# Patient Record
Sex: Female | Born: 1970
Health system: Southern US, Community
[De-identification: ages and names within clinical notes are randomized; demographics above are authoritative.]

## PROBLEM LIST (undated history)

## (undated) DIAGNOSIS — F419 Anxiety disorder, unspecified: Secondary | ICD-10-CM

## (undated) DIAGNOSIS — F329 Major depressive disorder, single episode, unspecified: Secondary | ICD-10-CM

## (undated) DIAGNOSIS — D649 Anemia, unspecified: Secondary | ICD-10-CM

## (undated) DIAGNOSIS — F32A Depression, unspecified: Secondary | ICD-10-CM

## (undated) DIAGNOSIS — E785 Hyperlipidemia, unspecified: Secondary | ICD-10-CM

## (undated) DIAGNOSIS — I10 Essential (primary) hypertension: Secondary | ICD-10-CM

## (undated) HISTORY — DX: Essential (primary) hypertension: I10

## (undated) HISTORY — DX: Depression, unspecified: F32.A

## (undated) HISTORY — DX: Major depressive disorder, single episode, unspecified: F32.9

## (undated) HISTORY — DX: Anemia, unspecified: D64.9

## (undated) HISTORY — PX: TOOTH EXTRACTION: SHX859

## (undated) HISTORY — DX: Hyperlipidemia, unspecified: E78.5

## (undated) HISTORY — DX: Anxiety disorder, unspecified: F41.9

## (undated) HISTORY — DX: Morbid (severe) obesity due to excess calories: E66.01

---

## 2004-01-20 ENCOUNTER — Ambulatory Visit (HOSPITAL_COMMUNITY): Admission: RE | Admit: 2004-01-20 | Discharge: 2004-01-20 | Payer: Self-pay | Admitting: Cardiology

## 2004-01-25 ENCOUNTER — Other Ambulatory Visit: Admission: RE | Admit: 2004-01-25 | Discharge: 2004-01-25 | Payer: Self-pay | Admitting: Internal Medicine

## 2004-06-13 ENCOUNTER — Ambulatory Visit: Payer: Self-pay | Admitting: Internal Medicine

## 2004-06-19 ENCOUNTER — Ambulatory Visit: Payer: Self-pay | Admitting: Family Medicine

## 2004-06-22 ENCOUNTER — Ambulatory Visit: Payer: Self-pay | Admitting: Family Medicine

## 2004-06-27 ENCOUNTER — Ambulatory Visit: Payer: Self-pay | Admitting: Family Medicine

## 2004-07-03 ENCOUNTER — Ambulatory Visit: Payer: Self-pay | Admitting: Family Medicine

## 2004-07-11 ENCOUNTER — Ambulatory Visit: Payer: Self-pay | Admitting: Family Medicine

## 2004-07-12 ENCOUNTER — Ambulatory Visit: Payer: Self-pay | Admitting: *Deleted

## 2004-10-24 ENCOUNTER — Ambulatory Visit: Payer: Self-pay | Admitting: Family Medicine

## 2004-10-24 LAB — CONVERTED CEMR LAB: Hgb A1c MFr Bld: 10.9 %

## 2004-11-03 ENCOUNTER — Ambulatory Visit: Payer: Self-pay | Admitting: Family Medicine

## 2004-11-06 ENCOUNTER — Ambulatory Visit: Payer: Self-pay | Admitting: Family Medicine

## 2004-11-06 LAB — CONVERTED CEMR LAB: Hgb A1c MFr Bld: 9 %

## 2004-11-16 ENCOUNTER — Ambulatory Visit: Payer: Self-pay | Admitting: Family Medicine

## 2005-01-10 ENCOUNTER — Ambulatory Visit: Payer: Self-pay | Admitting: Family Medicine

## 2005-02-23 ENCOUNTER — Ambulatory Visit: Payer: Self-pay | Admitting: Internal Medicine

## 2005-03-22 ENCOUNTER — Ambulatory Visit: Payer: Self-pay | Admitting: Family Medicine

## 2005-10-08 ENCOUNTER — Ambulatory Visit: Payer: Self-pay | Admitting: Family Medicine

## 2005-10-08 LAB — CONVERTED CEMR LAB: Hgb A1c MFr Bld: 9.4 %

## 2005-10-24 ENCOUNTER — Encounter (INDEPENDENT_AMBULATORY_CARE_PROVIDER_SITE_OTHER): Payer: Self-pay | Admitting: Family Medicine

## 2005-10-24 LAB — CONVERTED CEMR LAB: Pap Smear: NORMAL

## 2005-11-19 ENCOUNTER — Ambulatory Visit: Payer: Self-pay | Admitting: Family Medicine

## 2005-11-19 ENCOUNTER — Encounter (INDEPENDENT_AMBULATORY_CARE_PROVIDER_SITE_OTHER): Payer: Self-pay | Admitting: Family Medicine

## 2006-01-09 ENCOUNTER — Ambulatory Visit: Payer: Self-pay | Admitting: Family Medicine

## 2006-03-06 ENCOUNTER — Ambulatory Visit: Payer: Self-pay | Admitting: Family Medicine

## 2006-08-03 ENCOUNTER — Encounter (INDEPENDENT_AMBULATORY_CARE_PROVIDER_SITE_OTHER): Payer: Self-pay | Admitting: Family Medicine

## 2006-08-03 DIAGNOSIS — F329 Major depressive disorder, single episode, unspecified: Secondary | ICD-10-CM | POA: Insufficient documentation

## 2006-12-11 ENCOUNTER — Encounter (INDEPENDENT_AMBULATORY_CARE_PROVIDER_SITE_OTHER): Payer: Self-pay | Admitting: *Deleted

## 2007-05-29 ENCOUNTER — Encounter (INDEPENDENT_AMBULATORY_CARE_PROVIDER_SITE_OTHER): Payer: Self-pay | Admitting: Family Medicine

## 2007-10-06 ENCOUNTER — Encounter: Admission: RE | Admit: 2007-10-06 | Discharge: 2007-12-16 | Payer: Self-pay | Admitting: Internal Medicine

## 2011-08-10 ENCOUNTER — Ambulatory Visit (INDEPENDENT_AMBULATORY_CARE_PROVIDER_SITE_OTHER): Payer: Private Health Insurance - Indemnity | Admitting: Obstetrics and Gynecology

## 2011-08-10 ENCOUNTER — Encounter: Payer: Self-pay | Admitting: Obstetrics and Gynecology

## 2011-08-10 VITALS — BP 106/60 | Resp 16 | Ht 64.5 in | Wt 303.0 lb

## 2011-08-10 DIAGNOSIS — Z124 Encounter for screening for malignant neoplasm of cervix: Secondary | ICD-10-CM

## 2011-08-10 LAB — HIV ANTIBODY (ROUTINE TESTING W REFLEX): HIV: NONREACTIVE
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
## 2011-08-15 LAB — PAP IG W/ RFLX HPV ASCU

## 2012-06-24 ENCOUNTER — Emergency Department (HOSPITAL_COMMUNITY)
Admission: EM | Admit: 2012-06-24 | Discharge: 2012-06-24 | Disposition: A | Discharge status: Home / Self Care | Payer: No Typology Code available for payment source | Source: Home / Self Care

## 2012-06-24 ENCOUNTER — Encounter (HOSPITAL_COMMUNITY): Payer: Self-pay

## 2012-06-24 DIAGNOSIS — E785 Hyperlipidemia, unspecified: Secondary | ICD-10-CM

## 2012-06-24 DIAGNOSIS — I10 Essential (primary) hypertension: Secondary | ICD-10-CM

## 2012-06-24 DIAGNOSIS — F329 Major depressive disorder, single episode, unspecified: Secondary | ICD-10-CM

## 2012-06-24 DIAGNOSIS — E119 Type 2 diabetes mellitus without complications: Secondary | ICD-10-CM

## 2012-06-24 DIAGNOSIS — J329 Chronic sinusitis, unspecified: Secondary | ICD-10-CM

## 2012-06-24 LAB — BASIC METABOLIC PANEL
BUN: 11 mg/dL (ref 6–23)
CO2: 23 mEq/L (ref 19–32)
Calcium: 9.1 mg/dL (ref 8.4–10.5)
Chloride: 97 mEq/L (ref 96–112)
Creatinine, Ser: 0.51 mg/dL (ref 0.50–1.10)
GFR calc Af Amer: >90 mL/min (ref >90)
GFR calc non Af Amer: >90 mL/min (ref >90)
Glucose, Bld: 319 mg/dL — ABNORMAL HIGH (ref 70–99)
Potassium: 3.8 mEq/L (ref 3.5–5.1)
Sodium: 132 mEq/L — ABNORMAL LOW (ref 135–145)

## 2012-06-24 LAB — LIPID PANEL
Cholesterol: 252 mg/dL — ABNORMAL HIGH (ref 0–200)
HDL: 45 mg/dL (ref >39)
LDL Cholesterol: 183 mg/dL — ABNORMAL HIGH (ref 0–99)
Total CHOL/HDL Ratio: 5.6 RATIO
Triglycerides: 121 mg/dL (ref <150)
VLDL: 24 mg/dL (ref 0–40)

## 2012-06-24 LAB — HEMOGLOBIN A1C
Hgb A1c MFr Bld: 12.2 % — ABNORMAL HIGH (ref <5.7)
Mean Plasma Glucose: 303 mg/dL — ABNORMAL HIGH (ref <117)

## 2012-06-24 MED ORDER — PIOGLITAZONE HCL 30 MG PO TABS: 30.0000 mg | ORAL_TABLET | Freq: Every day | ORAL | Status: DC

## 2012-06-24 MED ORDER — VITAMIN D 1000 UNITS PO TABS: 1000.0000 [IU] | ORAL_TABLET | Freq: Every day | ORAL | Status: DC

## 2012-06-24 MED ORDER — HYDROCHLOROTHIAZIDE 25 MG PO TABS: 25.0000 mg | ORAL_TABLET | Freq: Every day | ORAL | Status: DC

## 2012-06-24 MED ORDER — FLUCONAZOLE 100 MG PO TABS: 100.0000 mg | ORAL_TABLET | Freq: Every day | ORAL | Status: DC

## 2012-06-24 MED ORDER — AMOXICILLIN 500 MG PO TABS: 500.0000 mg | ORAL_TABLET | Freq: Two times a day (BID) | ORAL | Status: DC

## 2012-06-24 MED ORDER — SERTRALINE HCL 50 MG PO TABS: 50.0000 mg | ORAL_TABLET | Freq: Every day | ORAL | Status: DC

## 2012-06-24 MED ORDER — METFORMIN HCL 500 MG PO TABS: 500.0000 mg | ORAL_TABLET | Freq: Two times a day (BID) | ORAL | Status: DC

## 2012-06-24 MED ORDER — FERROUS SULFATE 325 (65 FE) MG PO TABS: 325.0000 mg | ORAL_TABLET | Freq: Every day | ORAL | Status: DC

## 2012-06-24 MED ORDER — INSULIN DETEMIR 100 UNIT/ML ~~LOC~~ SOLN: 10.0000 [IU] | Freq: Every day | SUBCUTANEOUS | Status: DC

## 2012-06-24 MED ORDER — ATORVASTATIN CALCIUM 10 MG PO TABS: 10.0000 mg | ORAL_TABLET | Freq: Every day | ORAL | Status: DC

## 2012-06-24 NOTE — ED Provider Notes (Signed)
History     CSN: [id number]  Arrival date & time 06/24/12  1026   First MD Initiated Contact with Patient 06/24/12 1104      Chief Complaint  Patient presents with  . Establish Care    (Consider location/radiation/quality/duration/timing/severity/associated sxs/prior treatment) HPI Patient is 42 year old female who presents to clinic for followup of diabetes, hypertension, hyperlipidemia. She reports being out of medicines for 3 months. She expresses concern with sugar levels, she has not been checking that regularly as she did not have any supplies. Patient denies chest or shortness of breath, no specific abdominal concerns. Patient reports urinary frequency, polydipsia, dry mouth. Patient denies recent sicknesses or hospitalizations. Patient also explains she has been struggling with sinus congestion for over 2 weeks now associated with subjective fevers and chills, congestion and rhinorrhea as well as postnasal drip.  Past Medical History  Diagnosis Date  . Diabetes mellitus   . Anemia   . Anxiety   . Depression     Past Surgical History  Procedure Laterality Date  . Tooth extraction      Family History  Problem Relation Age of Onset  . Heart disease Mother   . Heart disease Father   . Asthma Sister   . Diabetes Sister   . Diabetes Mother     History  Substance Use Topics  . Smoking status: Never Smoker   . Smokeless tobacco: Never Used  . Alcohol Use: 1.0 oz/week    2 drink(s) per week     Comment: 1-2 glasses wine     OB History   Grav Para Term Preterm Abortions TAB SAB Ect Mult Living   0 0 0 0 0 0 0 0 0 0       Review of Systems  Constitutional: Negative for activity change, appetite change and fatigue.  HENT: Negative for ear pain, nosebleeds, facial swelling, neck pain, neck stiffness and ear discharge.   Eyes: Negative for pain, discharge, redness, itching and visual disturbance.  Respiratory: Negative for cough, choking, chest tightness, shortness  of breath, wheezing and stridor.   Cardiovascular: Negative for chest pain, palpitations and leg swelling.  Gastrointestinal: Negative for abdominal distention.  Genitourinary: Negative for dysuria, hematuria, flank pain, decreased urine volume, difficulty urinating and dyspareunia.  Musculoskeletal: Negative for back pain, joint swelling, arthralgias and gait problem.  Neurological: Negative for dizziness, tremors, seizures, syncope, facial asymmetry, speech difficulty, weakness, light-headedness, numbness and headaches.  Hematological: Negative for adenopathy. Does not bruise/bleed easily.  Psychiatric/Behavioral: Negative for hallucinations, behavioral problems, confusion, dysphoric mood, decreased concentration and agitation.    Allergies  Review of patient's allergies indicates no known allergies.  Home Medications   Current Outpatient Rx  Name  Route  Sig  Dispense  Refill  . amoxicillin (AMOXIL) 500 MG tablet   Oral   Take 1 tablet (500 mg total) by mouth 2 (two) times daily.   14 tablet   0   . atorvastatin (LIPITOR) 10 MG tablet   Oral   Take 1 tablet (10 mg total) by mouth at bedtime.   30 tablet   5   . cholecalciferol (VITAMIN D) 1000 UNITS tablet   Oral   Take 1 tablet (1,000 Units total) by mouth daily.   30 tablet   5   . ferrous sulfate 325 (65 FE) MG tablet   Oral   Take 1 tablet (325 mg total) by mouth daily with breakfast.   30 tablet   5   . fluconazole (DIFLUCAN) 100  MG tablet   Oral   Take 1 tablet (100 mg total) by mouth daily.   10 tablet   0   . hydrochlorothiazide (HYDRODIURIL) 25 MG tablet   Oral   Take 1 tablet (25 mg total) by mouth daily.   30 tablet   5   . insulin detemir (LEVEMIR FLEXPEN) 100 UNIT/ML injection   Subcutaneous   Inject 0.1 mLs (10 Units total) into the skin at bedtime.   10 mL   5   . metFORMIN (GLUCOPHAGE) 500 MG tablet   Oral   Take 1 tablet (500 mg total) by mouth 2 (two) times daily with a meal.   60  tablet   5   . pioglitazone (ACTOS) 30 MG tablet   Oral   Take 1 tablet (30 mg total) by mouth daily.   30 tablet   5   . sertraline (ZOLOFT) 50 MG tablet   Oral   Take 1 tablet (50 mg total) by mouth daily.   30 tablet   5     BP 142/89  Pulse 86  Temp(Src) 98.8 F (37.1 C) (Oral)  SpO2 100%  Physical Exam  Constitutional: Appears well-developed and well-nourished. No distress.  HENT: Normocephalic. External right and left ear normal. Oropharynx is clear and moist.  Eyes: Conjunctivae and EOM are normal. PERRLA, no scleral icterus.  Neck: Normal ROM. Neck supple. No JVD. No tracheal deviation. No thyromegaly.  CVS: RRR, S1/S2 +, no murmurs, no gallops, no carotid bruit.  Pulmonary: Effort and breath sounds normal, no stridor, rhonchi, wheezes, rales.  Abdominal: Soft. BS +,  no distension, tenderness, rebound or guarding.  Musculoskeletal: Normal range of motion. No edema and no tenderness.  Lymphadenopathy: No lymphadenopathy noted, cervical, inguinal. Neuro: Alert. Normal reflexes, muscle tone coordination. No cranial nerve deficit. Skin: Skin is warm and dry. No rash noted. Not diaphoretic. No erythema. No pallor.  Psychiatric: Normal mood and affect. Behavior, judgment, thought content normal.    ED Course  Procedures (including critical care time)  Labs Reviewed  BASIC METABOLIC PANEL  HEMOGLOBIN A1C  LIPID PANEL   No results found.   1. DIABETES MELLITUS, TYPE II - will check A1c today, will provide prescription on home diabetes medications. I will start patient on Lantus 4 units as this was date amount she was taking 3 months prior to this visit. Also prescribe medicines she was previously taking metformin and Actos   2. HTN (hypertension) - slightly above target range today, we have discussed target range blood pressure, patient will continue to check her blood pressure regularly and call us back if the numbers are higher than 140/90   3. HLD  (hyperlipidemia) - will check lipid panel today  4. Sinusitis - course of amoxicillin for 7 days       MDM  Diabetes, hypertension, hyperlipidemia, acute sinusitis         Dorothea Ogle, MD 06/24/12 1136

## 2012-06-24 NOTE — ED Notes (Signed)
Patient here to establish primary care doctor History of DM

## 2012-07-17 ENCOUNTER — Telehealth (HOSPITAL_COMMUNITY): Payer: Self-pay

## 2012-07-17 NOTE — ED Notes (Signed)
Spoke with patient- lab results given Patient stated she has not been able to get all her medications Because she can not afford them. I gave her ideas on how to lower here cholesterol

## 2012-11-02 ENCOUNTER — Ambulatory Visit (HOSPITAL_COMMUNITY)
Admission: RE | Admit: 2012-11-02 | Discharge: 2012-11-02 | Disposition: A | Discharge status: Home / Self Care | Payer: No Typology Code available for payment source | Source: Ambulatory Visit | Attending: Emergency Medicine | Admitting: Emergency Medicine

## 2012-11-02 ENCOUNTER — Emergency Department (HOSPITAL_COMMUNITY)
Admission: EM | Admit: 2012-11-02 | Discharge: 2012-11-02 | Disposition: A | Discharge status: Home / Self Care | Payer: No Typology Code available for payment source | Attending: Emergency Medicine | Admitting: Emergency Medicine

## 2012-11-02 ENCOUNTER — Encounter (HOSPITAL_COMMUNITY): Payer: Self-pay | Admitting: *Deleted

## 2012-11-02 DIAGNOSIS — Z862 Personal history of diseases of the blood and blood-forming organs and certain disorders involving the immune mechanism: Secondary | ICD-10-CM | POA: Insufficient documentation

## 2012-11-02 DIAGNOSIS — E1169 Type 2 diabetes mellitus with other specified complication: Secondary | ICD-10-CM | POA: Insufficient documentation

## 2012-11-02 DIAGNOSIS — M7989 Other specified soft tissue disorders: Secondary | ICD-10-CM | POA: Insufficient documentation

## 2012-11-02 DIAGNOSIS — R739 Hyperglycemia, unspecified: Secondary | ICD-10-CM

## 2012-11-02 DIAGNOSIS — R358 Other polyuria: Secondary | ICD-10-CM | POA: Insufficient documentation

## 2012-11-02 DIAGNOSIS — Z8659 Personal history of other mental and behavioral disorders: Secondary | ICD-10-CM | POA: Insufficient documentation

## 2012-11-02 DIAGNOSIS — R3589 Other polyuria: Secondary | ICD-10-CM | POA: Insufficient documentation

## 2012-11-02 DIAGNOSIS — R51 Headache: Secondary | ICD-10-CM | POA: Insufficient documentation

## 2012-11-02 DIAGNOSIS — Z79899 Other long term (current) drug therapy: Secondary | ICD-10-CM | POA: Insufficient documentation

## 2012-11-02 LAB — GLUCOSE, CAPILLARY: Glucose-Capillary: 252 mg/dL — ABNORMAL HIGH (ref 70–99)

## 2012-11-02 LAB — CBC
HCT: 33.2 % — ABNORMAL LOW (ref 36.0–46.0)
Hemoglobin: 11.3 g/dL — ABNORMAL LOW (ref 12.0–15.0)
MCH: 25.9 pg — ABNORMAL LOW (ref 26.0–34.0)
MCHC: 34 g/dL (ref 30.0–36.0)
MCV: 76.1 fL — ABNORMAL LOW (ref 78.0–100.0)
Platelets: 341 10*3/uL (ref 150–400)
RBC: 4.36 MIL/uL (ref 3.87–5.11)
RDW: 13.7 % (ref 11.5–15.5)
WBC: 6.5 10*3/uL (ref 4.0–10.5)

## 2012-11-02 LAB — POCT I-STAT, CHEM 8
BUN: 8 mg/dL (ref 6–23)
Creatinine, Ser: 0.8 mg/dL (ref 0.50–1.10)
Potassium: 4.2 mEq/L (ref 3.5–5.1)
Sodium: 134 mEq/L — ABNORMAL LOW (ref 135–145)

## 2012-11-02 MED ORDER — SODIUM CHLORIDE 0.9 % IV BOLUS (SEPSIS)
1000.0000 mL | Freq: Once | INTRAVENOUS | Status: AC
  Administered 2012-11-02: 1000 mL via INTRAVENOUS

## 2012-11-02 NOTE — ED Notes (Signed)
Pt states for two days she has had right arm pain/numbness,  And headache started two hours ago, and she also has extra "sac"  Between her vagina and anus, possibly abscess

## 2012-11-02 NOTE — Progress Notes (Signed)
VASCULAR LAB PRELIMINARY  PRELIMINARY  PRELIMINARY  PRELIMINARY  Right upper extremity venous duplex completed.    Preliminary report:  Right:  No evidence of DVT or superficial thrombosis.    Khari Lett, RVT 11/02/2012, 9:13 AM

## 2012-11-02 NOTE — ED Provider Notes (Signed)
CSN: [id number]     Arrival date & time 11/02/12  0231 History     First MD Initiated Contact with Patient 11/02/12 0248     Chief Complaint  Patient presents with  . Arm Pain  . Abscess  . Headache   (Consider location/radiation/quality/duration/timing/severity/associated sxs/prior Treatment) HPI Hx per PT - RUE pain, swelling x 2 days. No CP or SOB. No trauma, no rash. No neck pain. Symptoms MOD in severity, no h/o DVT or similar symptoms. She is also concerned that her blood sugars are elevated today into the 300S  PT also c/o an uncomfortable area between her vagina and anus that she noticed after using the bathroom. No drainage. No F/C. No h/o same. Symptoms mild and reported as midline.   Past Medical History  Diagnosis Date  . Diabetes mellitus   . Anemia   . Anxiety   . Depression    Past Surgical History  Procedure Laterality Date  . Tooth extraction     Family History  Problem Relation Age of Onset  . Heart disease Mother   . Heart disease Father   . Asthma Sister   . Diabetes Sister   . Diabetes Mother    History  Substance Use Topics  . Smoking status: Never Smoker   . Smokeless tobacco: Never Used  . Alcohol Use: 1.0 oz/week    2 drink(s) per week     Comment: 1-2 glasses wine    OB History   Grav Para Term Preterm Abortions TAB SAB Ect Mult Living   0 0 0 0 0 0 0 0 0 0      Review of Systems  Constitutional: Negative for fever and chills.  HENT: Negative for neck pain.   Eyes: Negative for visual disturbance.  Respiratory: Negative for shortness of breath.   Cardiovascular: Negative for chest pain.  Gastrointestinal: Negative for vomiting and abdominal pain.  Endocrine: Positive for polyuria.  Genitourinary: Negative for dysuria.  Musculoskeletal: Negative for back pain.  Skin: Negative for rash.  Neurological: Negative for headaches.  All other systems reviewed and are negative.    Allergies  Review of patient's allergies indicates no  known allergies.  Home Medications   Current Outpatient Rx  Name  Route  Sig  Dispense  Refill  . acetaminophen (TYLENOL) 500 MG tablet   Oral   Take 500 mg by mouth every 6 (six) hours as needed for pain.         . Ibuprofen-Diphenhydramine HCl (ADVIL PM) 200-25 MG CAPS   Oral   Take 1 tablet by mouth at bedtime as needed (sleep/pain).         . metFORMIN (GLUCOPHAGE) 500 MG tablet   Oral   Take 1 tablet (500 mg total) by mouth 2 (two) times daily with a meal.   60 tablet   5   . sodium-potassium bicarbonate (ALKA-SELTZER GOLD) TBEF dissolvable tablet   Oral   Take 1 tablet by mouth 2 (two) times daily as needed (allergy symptoms).          BP 130/75  Pulse 80  Resp 17  SpO2 100%  LMP 10/06/2012 Physical Exam  Constitutional: She is oriented to person, place, and time. She appears well-developed and well-nourished.  HENT:  Head: Normocephalic and atraumatic.  Eyes: EOM are normal. Pupils are equal, round, and reactive to light.  Neck: Neck supple.  No C spine or trapezial tenderness  Cardiovascular: Normal rate, regular rhythm and intact distal pulses.  Pulmonary/Chest: Effort normal and breath sounds normal. No respiratory distress.  Abdominal: Soft. Bowel sounds are normal. She exhibits no distension. There is no tenderness.  Genitourinary:  Chaperone present. No tenderness or abscess. PT unable to localize area of swelling on evaluation. No fluctuance or abscess appreciated.   Musculoskeletal: Normal range of motion.  RUE with possible mild edema, limited exam 2/2 obesity. No cords or erythema or inc warmth to touch. Equal grips/ bicpes/ triceps. Distal n/v intact   Neurological: She is alert and oriented to person, place, and time.  Skin: Skin is warm and dry.    ED Course   Procedures (including critical care time)   Results for orders placed during the hospital encounter of 11/02/12  CBC      Result Value Range   WBC 6.5  4.0 - 10.5 K/uL   RBC  4.36  3.87 - 5.11 MIL/uL   Hemoglobin 11.3 (*) 12.0 - 15.0 g/dL   HCT 40.9 (*) 81.1 - 91.4 %   MCV 76.1 (*) 78.0 - 100.0 fL   MCH 25.9 (*) 26.0 - 34.0 pg   MCHC 34.0  30.0 - 36.0 g/dL   RDW 78.2  95.6 - 21.3 %   Platelets 341  150 - 400 K/uL  GLUCOSE, CAPILLARY      Result Value Range   Glucose-Capillary 252 (*) 70 - 99 mg/dL   Comment 1 Notify RN     Comment 2 Documented in Chart    POCT I-STAT, CHEM 8      Result Value Range   Sodium 134 (*) 135 - 145 mEq/L   Potassium 4.2  3.5 - 5.1 mEq/L   Chloride 100  96 - 112 mEq/L   BUN 8  6 - 23 mg/dL   Creatinine, Ser 0.86  0.50 - 1.10 mg/dL   Glucose, Bld 578 (*) 70 - 99 mg/dL   Calcium, Ion 4.69  6.29 - 1.23 mmol/L   TCO2 23  0 - 100 mmol/L   Hemoglobin 11.9 (*) 12.0 - 15.0 g/dL   HCT 52.8 (*) 41.3 - 24.4 %    1. Swelling of right extremity   2. Hyperglycemia    IVFs provided. PT declines any pain medications.   Recheck - feeling better with blood sugar improved  Plan d/c home with scheduled vascular US to evaluate RUE swelling. PT agrees to hyperglycemia precautions, she agree to warm soaks for possible early abscess and recheck for any worsening condition.  MDM  RUE swelling  - no PE symptoms  Also has hyperglycemia improved with IVFs  Labs reviewed as above  VS and nursese notes reviewed and considered  Sunnie Nielsen, MD 11/02/12 [phone]

## 2012-11-02 NOTE — ED Notes (Signed)
MD at bedside. 

## 2012-11-05 ENCOUNTER — Ambulatory Visit: Payer: No Typology Code available for payment source | Attending: Family Medicine | Admitting: Internal Medicine

## 2012-11-05 ENCOUNTER — Encounter: Payer: Self-pay | Admitting: Internal Medicine

## 2012-11-05 VITALS — BP 133/87 | HR 88 | Temp 98.4°F | Resp 16 | Wt 275.0 lb

## 2012-11-05 DIAGNOSIS — G8929 Other chronic pain: Secondary | ICD-10-CM | POA: Insufficient documentation

## 2012-11-05 DIAGNOSIS — M545 Low back pain, unspecified: Secondary | ICD-10-CM | POA: Insufficient documentation

## 2012-11-05 DIAGNOSIS — N924 Excessive bleeding in the premenopausal period: Secondary | ICD-10-CM

## 2012-11-05 DIAGNOSIS — F3289 Other specified depressive episodes: Secondary | ICD-10-CM

## 2012-11-05 DIAGNOSIS — F32A Depression, unspecified: Secondary | ICD-10-CM | POA: Insufficient documentation

## 2012-11-05 DIAGNOSIS — Z79899 Other long term (current) drug therapy: Secondary | ICD-10-CM | POA: Insufficient documentation

## 2012-11-05 DIAGNOSIS — F329 Major depressive disorder, single episode, unspecified: Secondary | ICD-10-CM | POA: Insufficient documentation

## 2012-11-05 DIAGNOSIS — E119 Type 2 diabetes mellitus without complications: Secondary | ICD-10-CM

## 2012-11-05 LAB — POCT GLYCOSYLATED HEMOGLOBIN (HGB A1C): Hemoglobin A1C: 10.6

## 2012-11-05 LAB — POCT UA - GLUCOSE/PROTEIN: Protein, UA: NEGATIVE

## 2012-11-05 MED ORDER — SERTRALINE HCL 50 MG PO TABS: 50.0000 mg | ORAL_TABLET | Freq: Every day | ORAL | Status: DC

## 2012-11-05 MED ORDER — NAPROXEN 500 MG PO TABS: 500.0000 mg | ORAL_TABLET | Freq: Two times a day (BID) | ORAL | Status: DC

## 2012-11-05 MED ORDER — GLIPIZIDE 5 MG PO TABS: 5.0000 mg | ORAL_TABLET | Freq: Two times a day (BID) | ORAL | Status: DC

## 2012-11-05 NOTE — Progress Notes (Signed)
Patient ID: Meghan Finley, female   DOB: Apr 11, 1970, 42 y.o.   MRN: [id number]  CC: To establish care  HPI: Patient is a 42 years old woman here to establish care. She has past history of diabetes, anxiety and depression, and nonspecific anemia. She is on metformin, on pain medication for chronic back pain. She is also an antidepressant Zoloft 50 mg tablet by mouth day. She has no significant complaint today except for the back pain which is chronic, she also wants a refill of all her current medications. She does not smoke cigarette, she drinks alcohol occasionally.  No Known Allergies Past Medical History  Diagnosis Date  . Diabetes mellitus   . Anemia   . Anxiety   . Depression    Current Outpatient Prescriptions on File Prior to Visit  Medication Sig Dispense Refill  . acetaminophen (TYLENOL) 500 MG tablet Take 500 mg by mouth every 6 (six) hours as needed for pain.      . metFORMIN (GLUCOPHAGE) 500 MG tablet Take 1 tablet (500 mg total) by mouth 2 (two) times daily with a meal.  60 tablet  5  . Ibuprofen-Diphenhydramine HCl (ADVIL PM) 200-25 MG CAPS Take 1 tablet by mouth at bedtime as needed (sleep/pain).      . sodium-potassium bicarbonate (ALKA-SELTZER GOLD) TBEF dissolvable tablet Take 1 tablet by mouth 2 (two) times daily as needed (allergy symptoms).       No current facility-administered medications on file prior to visit.   Family History  Problem Relation Age of Onset  . Heart disease Mother   . Heart disease Father   . Asthma Sister   . Diabetes Sister   . Diabetes Mother    History   Social History  . Marital Status: Single    Spouse Name: N/A    Number of Children: N/A  . Years of Education: N/A   Occupational History  . Not on file.   Social History Main Topics  . Smoking status: Never Smoker   . Smokeless tobacco: Never Used  . Alcohol Use: 1.0 oz/week    2 drink(s) per week     Comment: 1-2 glasses wine   . Drug Use: No  . Sexual Activity: Yes   Partners: Male    Birth Control/ Protection: None   Other Topics Concern  . Not on file   Social History Narrative  . No narrative on file    Review of Systems: Constitutional: Negative for fever, chills, diaphoresis, activity change, appetite change and fatigue. HENT: Negative for ear pain, nosebleeds, congestion, facial swelling, rhinorrhea, neck pain, neck stiffness and ear discharge.  Eyes: Negative for pain, discharge, redness, itching and visual disturbance. Respiratory: Negative for cough, choking, chest tightness, shortness of breath, wheezing and stridor.  Cardiovascular: Negative for chest pain, palpitations and leg swelling. Gastrointestinal: Negative for abdominal distention. Genitourinary: Negative for dysuria, urgency, frequency, hematuria, flank pain, decreased urine volume, difficulty urinating and dyspareunia.  Musculoskeletal: Negative for back pain, joint swelling, arthralgias and gait problem. Neurological: Negative for dizziness, tremors, seizures, syncope, facial asymmetry, speech difficulty, weakness, light-headedness, numbness and headaches.  Hematological: Negative for adenopathy. Does not bruise/bleed easily. Psychiatric/Behavioral: Negative for hallucinations, behavioral problems, confusion, dysphoric mood, decreased concentration and agitation.    Objective:   Filed Vitals:   11/05/12 1238  BP: 133/87  Pulse: 88  Temp: 98.4 F (36.9 C)  Resp: 16    Physical Exam: Constitutional: Patient appears well-developed and well-nourished. No distress. HENT: Normocephalic, atraumatic, External right  and left ear normal. Oropharynx is clear and moist.  Eyes: Conjunctivae and EOM are normal. PERRLA, no scleral icterus. Neck: Normal ROM. Neck supple. No JVD. No tracheal deviation. No thyromegaly. CVS: RRR, S1/S2 +, no murmurs, no gallops, no carotid bruit.  Pulmonary: Effort and breath sounds normal, no stridor, rhonchi, wheezes, rales.  Abdominal: Soft. BS +,   no distension, tenderness, rebound or guarding.  Musculoskeletal: Normal range of motion. No edema and no tenderness.  Lymphadenopathy: No lymphadenopathy noted, cervical, inguinal or axillary Neuro: Alert. Normal reflexes, muscle tone coordination. No cranial nerve deficit. Skin: Skin is warm and dry. No rash noted. Not diaphoretic. No erythema. No pallor. Psychiatric: Normal mood and affect. Behavior, judgment, thought content normal.  Lab Results  Component Value Date   WBC 6.5 11/02/2012   HGB 11.9* 11/02/2012   HCT 35.0* 11/02/2012   MCV 76.1* 11/02/2012   PLT 341 11/02/2012   Lab Results  Component Value Date   CREATININE 0.80 11/02/2012   BUN 8 11/02/2012   NA 134* 11/02/2012   K 4.2 11/02/2012   CL 100 11/02/2012   CO2 23 06/24/2012    Lab Results  Component Value Date   HGBA1C 10.6 11/05/2012   Lipid Panel     Component Value Date/Time   CHOL 252* 06/24/2012 1140   TRIG 121 06/24/2012 1140   HDL 45 06/24/2012 1140   CHOLHDL 5.6 06/24/2012 1140   VLDL 24 06/24/2012 1140   LDLCALC 183* 06/24/2012 1140       Assessment and plan:   Patient Active Problem List   Diagnosis Date Noted  . Diabetes 11/05/2012  . Depression (emotion) 11/05/2012  . Menorrhagia, premenopausal 11/05/2012  . Low back pain 11/05/2012  . DIABETES MELLITUS, TYPE II 08/03/2006  . DEPRESSION 08/03/2006   Labs: CBC D. CMP Lipid panel TSH and free T4 Urinalysis Hemoglobin A1c  Refill the following medications Metformin 500 mg tablet by mouth twice a day Glipizide 5 mg tablet by mouth daily Zoloft 50 mg tablet daily  For back pain: Naproxen 500 mg tablet by mouth 2 times a day when necessary pain  Patient was extensively counseled about nutrition and exercise  Meghan Finley was given clear instructions to go to ER or return to the clinic if symptoms don't improve, worsen or new problems develop.  Meghan Finley verbalized understanding.  Meghan Finley was told to call to get lab results if hasn't heard  anything in the next week.        Jeanann Lewandowsky, MD [facility] [place], [place] [phone]   11/05/2012, 1:16 PM

## 2012-11-05 NOTE — Progress Notes (Signed)
Pt here to establish care Hx Diabetes- taking prescribed medication Chronic back/ left arm pain

## 2012-11-06 LAB — COMPLETE METABOLIC PANEL WITH GFR
ALT: 12 U/L (ref 0–35)
Alkaline Phosphatase: 59 U/L (ref 39–117)
Creat: 0.65 mg/dL (ref 0.50–1.10)
GFR, Est Non African American: >89 mL/min
Glucose, Bld: 220 mg/dL — ABNORMAL HIGH (ref 70–99)
Sodium: 135 mEq/L (ref 135–145)
Total Bilirubin: 0.4 mg/dL (ref 0.3–1.2)
Total Protein: 7 g/dL (ref 6.0–8.3)

## 2012-11-06 LAB — LIPID PANEL
Cholesterol: 256 mg/dL — ABNORMAL HIGH (ref 0–200)
Triglycerides: 124 mg/dL (ref <150)
VLDL: 25 mg/dL (ref 0–40)

## 2012-11-07 LAB — TSH+FREE T4: Free T4: 1.28 ng/dL (ref 0.82–1.77)

## 2012-12-05 ENCOUNTER — Ambulatory Visit: Payer: No Typology Code available for payment source | Attending: Internal Medicine | Admitting: Internal Medicine

## 2012-12-05 ENCOUNTER — Encounter: Payer: Self-pay | Admitting: Internal Medicine

## 2012-12-05 VITALS — BP 134/84 | HR 73 | Temp 98.6°F | Resp 16 | Ht 66.0 in | Wt 275.0 lb

## 2012-12-05 DIAGNOSIS — Z7982 Long term (current) use of aspirin: Secondary | ICD-10-CM | POA: Insufficient documentation

## 2012-12-05 DIAGNOSIS — M545 Low back pain, unspecified: Secondary | ICD-10-CM | POA: Insufficient documentation

## 2012-12-05 DIAGNOSIS — Z79899 Other long term (current) drug therapy: Secondary | ICD-10-CM | POA: Insufficient documentation

## 2012-12-05 DIAGNOSIS — E119 Type 2 diabetes mellitus without complications: Secondary | ICD-10-CM

## 2012-12-05 NOTE — Progress Notes (Signed)
Patient ID: Meghan Finley, female   DOB: [birth year], 42 y.o.   MRN: [id number]   CC: Followup  HPI: Patient is 42 year old female with diabetes who presents to clinic with main concern of progressively worsening lower back pain that initially started several months prior to this visit, intermittent throbbing in nature, 7/10 in severity when present, worse with walking and somewhat relieved with rest. Patient denies any specific radiating symptoms. She denies fevers and chills, no numbness and tingling. She reports compliance with diabetic medicine glipizide. She has been on metformin initially but reports intolerance and muscle aches. She denies muscle aches since she has stopped taking metformin.  No Known Allergies Past Medical History  Diagnosis Date  . Diabetes mellitus   . Anemia   . Anxiety   . Depression    Current Outpatient Prescriptions on File Prior to Visit  Medication Sig Dispense Refill  . acetaminophen (TYLENOL) 500 MG tablet Take 500 mg by mouth every 6 (six) hours as needed for pain.      Marland Kitchen aspirin 81 MG tablet Take 81 mg by mouth as needed for pain.      Marland Kitchen glipiZIDE (GLUCOTROL) 5 MG tablet Take 1 tablet (5 mg total) by mouth 2 (two) times daily before a meal.  60 tablet  3  . Ibuprofen-Diphenhydramine HCl (ADVIL PM) 200-25 MG CAPS Take 1 tablet by mouth at bedtime as needed (sleep/pain).      . metFORMIN (GLUCOPHAGE) 500 MG tablet Take 1 tablet (500 mg total) by mouth 2 (two) times daily with a meal.  60 tablet  5  . naproxen (NAPROSYN) 500 MG tablet Take 1 tablet (500 mg total) by mouth 2 (two) times daily with a meal.  30 tablet  0  . sertraline (ZOLOFT) 50 MG tablet Take 1 tablet (50 mg total) by mouth daily.  30 tablet  3  . sodium-potassium bicarbonate (ALKA-SELTZER GOLD) TBEF dissolvable tablet Take 1 tablet by mouth 2 (two) times daily as needed (allergy symptoms).       No current facility-administered medications on file prior to visit.   Family History  Problem  Relation Age of Onset  . Heart disease Mother   . Heart disease Father   . Asthma Sister   . Diabetes Sister   . Diabetes Mother    History   Social History  . Marital Status: Single    Spouse Name: N/A    Number of Children: N/A  . Years of Education: N/A   Occupational History  . Not on file.   Social History Main Topics  . Smoking status: Never Smoker   . Smokeless tobacco: Never Used  . Alcohol Use: 1.0 oz/week    2 drink(s) per week     Comment: 1-2 glasses wine   . Drug Use: No  . Sexual Activity: Yes    Partners: Male    Birth Control/ Protection: None   Other Topics Concern  . Not on file   Social History Narrative  . No narrative on file    Review of Systems  Constitutional: Negative for fever, chills, diaphoresis, activity change, appetite change and fatigue.  HENT: Negative for ear pain, nosebleeds, congestion, facial swelling, rhinorrhea, neck pain, neck stiffness and ear discharge.   Eyes: Negative for pain, discharge, redness, itching and visual disturbance.  Respiratory: Negative for cough, choking, chest tightness, shortness of breath, wheezing and stridor.   Cardiovascular: Negative for chest pain, palpitations and leg swelling.  Gastrointestinal: Negative for abdominal distention.  Genitourinary: Negative for dysuria, urgency, frequency, hematuria, flank pain, decreased urine volume, difficulty urinating and dyspareunia.  Musculoskeletal: Negative for joint swelling, arthralgias and gait problem.  Neurological: Negative for dizziness, tremors, seizures, syncope, facial asymmetry, speech difficulty, weakness, light-headedness, numbness and headaches.  Hematological: Negative for adenopathy. Does not bruise/bleed easily.  Psychiatric/Behavioral: Negative for hallucinations, behavioral problems, confusion, dysphoric mood, decreased concentration and agitation.    Objective:   Filed Vitals:   12/05/12 1156  BP: 134/84  Pulse: 73  Temp: 98.6 F (37  C)  Resp: 16    Physical Exam  Constitutional: Appears well-developed and well-nourished. No distress.  HENT: Normocephalic. External right and left ear normal. Oropharynx is clear and moist.  Eyes: Conjunctivae and EOM are normal. PERRLA, no scleral icterus.  Neck: Normal ROM. Neck supple. No JVD. No tracheal deviation. No thyromegaly.  CVS: RRR, S1/S2 +, no murmurs, no gallops, no carotid bruit.  Pulmonary: Effort and breath sounds normal, no stridor, rhonchi, wheezes, rales.  Abdominal: Soft. BS +,  no distension, tenderness, rebound or guarding.  Musculoskeletal: Normal range of motion,  paraspinal tenderness in lumbar area  Lymphadenopathy: No lymphadenopathy noted, cervical, inguinal. Neuro: Alert. Normal reflexes, muscle tone coordination. No cranial nerve deficit. Skin: Skin is warm and dry. No rash noted. Not diaphoretic. No erythema. No pallor.  Psychiatric: Normal mood and affect. Behavior, judgment, thought content normal.   Lab Results  Component Value Date   WBC 6.5 11/02/2012   HGB 11.9* 11/02/2012   HCT 35.0* 11/02/2012   MCV 76.1* 11/02/2012   PLT 341 11/02/2012   Lab Results  Component Value Date   CREATININE 0.65 11/05/2012   BUN 9 11/05/2012   NA 135 11/05/2012   K 4.8 11/05/2012   CL 100 11/05/2012   CO2 24 11/05/2012    Lab Results  Component Value Date   HGBA1C 10.6 11/05/2012   Lipid Panel     Component Value Date/Time   CHOL 256* 11/05/2012 1316   TRIG 124 11/05/2012 1316   HDL 53 11/05/2012 1316   CHOLHDL 4.8 11/05/2012 1316   VLDL 25 11/05/2012 1316   LDLCALC 178* 11/05/2012 1316       Assessment and plan:   Patient Active Problem List   Diagnosis Date Noted  . Diabetes - we have spent extensive time discussing meaning of A1c and importance of compliance with medical regimen. Patient is somewhat resistant to start taking metformin again and reports compliance with glipizide. I have explained that with that alone may not be sufficient in adequate  diabetic management. I have discussed importance of exercise and diet restrictions and we have discussed changes in diet that need to be implemented. We have agreed that patient will continue taking glipizide and when A1c is checked in November we can decide if metformin can be retried or patient needs to be started on insulin  11/05/2012  . Low back pain - will check lumbar spine x-ray  11/05/2012

## 2012-12-05 NOTE — Progress Notes (Signed)
Pt is here to F/U on her diabetes and refill her medication. Pt is also saying that her back has been hurting for two months . It started in her arm and now the pain is in her lower back. The pain is constant aching and sharp.

## 2012-12-05 NOTE — Patient Instructions (Signed)

## 2012-12-10 ENCOUNTER — Other Ambulatory Visit: Payer: Self-pay | Admitting: Internal Medicine

## 2012-12-10 ENCOUNTER — Ambulatory Visit (HOSPITAL_COMMUNITY)
Admission: RE | Admit: 2012-12-10 | Discharge: 2012-12-10 | Disposition: A | Discharge status: Home / Self Care | Payer: No Typology Code available for payment source | Source: Ambulatory Visit | Attending: Internal Medicine | Admitting: Internal Medicine

## 2012-12-10 ENCOUNTER — Telehealth (HOSPITAL_COMMUNITY): Payer: Self-pay | Admitting: Emergency Medicine

## 2012-12-10 DIAGNOSIS — M545 Low back pain, unspecified: Secondary | ICD-10-CM | POA: Insufficient documentation

## 2012-12-10 DIAGNOSIS — E119 Type 2 diabetes mellitus without complications: Secondary | ICD-10-CM

## 2012-12-10 DIAGNOSIS — D251 Intramural leiomyoma of uterus: Secondary | ICD-10-CM | POA: Insufficient documentation

## 2012-12-10 NOTE — ED Notes (Signed)
Rcvd call from Ultrasound needing to report results. From Outpt appt ordered by Dr Izola Price (while working at [facility]).  Spoke w/ dr Elisabeth Pigeon and she will take results.  Call transferred to Dr Elisabeth Pigeon.

## 2012-12-17 ENCOUNTER — Telehealth: Payer: Self-pay | Admitting: Internal Medicine

## 2012-12-17 NOTE — Telephone Encounter (Signed)
Pt called regarding the results of her x-ray and ultrasound, please contact pt

## 2012-12-18 ENCOUNTER — Telehealth: Payer: Self-pay | Admitting: Emergency Medicine

## 2012-12-18 DIAGNOSIS — D219 Benign neoplasm of connective and other soft tissue, unspecified: Secondary | ICD-10-CM

## 2012-12-18 NOTE — Telephone Encounter (Signed)
Spoke with pt and gave results. Pt needs to f/u with OG/GYN referral. Pt is scheduled for f/u appt in November.

## 2012-12-19 ENCOUNTER — Telehealth: Payer: Self-pay | Admitting: Emergency Medicine

## 2012-12-19 NOTE — Telephone Encounter (Signed)
RETURNED PT PHONE CALL. PT WAS SEEN YESTERDAY

## 2012-12-23 ENCOUNTER — Encounter: Payer: Self-pay | Admitting: Obstetrics & Gynecology

## 2013-01-28 ENCOUNTER — Encounter: Payer: Self-pay | Admitting: Obstetrics & Gynecology

## 2013-01-28 ENCOUNTER — Ambulatory Visit (INDEPENDENT_AMBULATORY_CARE_PROVIDER_SITE_OTHER): Payer: No Typology Code available for payment source | Admitting: Obstetrics & Gynecology

## 2013-01-28 VITALS — BP 156/83 | HR 100 | Temp 97.5°F | Ht 64.0 in | Wt 273.6 lb

## 2013-01-28 DIAGNOSIS — N920 Excessive and frequent menstruation with regular cycle: Secondary | ICD-10-CM

## 2013-01-28 DIAGNOSIS — Z01419 Encounter for gynecological examination (general) (routine) without abnormal findings: Secondary | ICD-10-CM

## 2013-01-28 DIAGNOSIS — D219 Benign neoplasm of connective and other soft tissue, unspecified: Secondary | ICD-10-CM | POA: Insufficient documentation

## 2013-01-28 DIAGNOSIS — D259 Leiomyoma of uterus, unspecified: Secondary | ICD-10-CM

## 2013-01-28 DIAGNOSIS — Z0000 Encounter for general adult medical examination without abnormal findings: Secondary | ICD-10-CM

## 2013-01-28 DIAGNOSIS — N946 Dysmenorrhea, unspecified: Secondary | ICD-10-CM

## 2013-01-28 DIAGNOSIS — D649 Anemia, unspecified: Secondary | ICD-10-CM

## 2013-01-28 MED ORDER — MISOPROSTOL 200 MCG PO TABS: ORAL_TABLET | ORAL | Status: DC

## 2013-01-28 NOTE — Progress Notes (Signed)
  Subjective:    Patient ID: Meghan Finley, female    DOB: 13-Apr-1970, 42 y.o.   MRN: [id number]  HPI  42 yo S AA G0 here today in the gyn clinic for evaluation of uterine fibroids. She reports LBP with periods for the last 3 months. She also complains of right arm pain with one of her periods. She is convinced that this pain is related to her period. She reports that her periods last about 7 days. She has been seen in the [facility] ER 9/14. A gyn u/s showed several 2-3 cm fibroids. She also complains of 1 month of costipation. She took mag citrate with some help.   Review of Systems Her pap smear is due. She declines a flu vaccine today.    Objective:   Physical Exam        Assessment & Plan:  Preventative care- schedule mammogram, pap smear done today Menorrhagia/dysmenorrhea- rec EMBX. Pretreat with cytotec

## 2013-02-04 ENCOUNTER — Ambulatory Visit: Payer: No Typology Code available for payment source | Attending: Internal Medicine | Admitting: Internal Medicine

## 2013-02-04 ENCOUNTER — Ambulatory Visit: Payer: No Typology Code available for payment source | Attending: Internal Medicine

## 2013-02-04 VITALS — BP 140/76 | HR 86 | Temp 97.8°F | Resp 16 | Ht 66.0 in | Wt 280.4 lb

## 2013-02-04 DIAGNOSIS — I10 Essential (primary) hypertension: Secondary | ICD-10-CM | POA: Insufficient documentation

## 2013-02-04 DIAGNOSIS — E1110 Type 2 diabetes mellitus with ketoacidosis without coma: Secondary | ICD-10-CM

## 2013-02-04 DIAGNOSIS — E785 Hyperlipidemia, unspecified: Secondary | ICD-10-CM | POA: Insufficient documentation

## 2013-02-04 DIAGNOSIS — E1310 Other specified diabetes mellitus with ketoacidosis without coma: Secondary | ICD-10-CM

## 2013-02-04 DIAGNOSIS — E119 Type 2 diabetes mellitus without complications: Secondary | ICD-10-CM | POA: Insufficient documentation

## 2013-02-04 LAB — GLUCOSE, POCT (MANUAL RESULT ENTRY): POC Glucose: 300 mg/dl — AB (ref 70–99)

## 2013-02-04 MED ORDER — GLIPIZIDE 5 MG PO TABS: 5.0000 mg | ORAL_TABLET | Freq: Two times a day (BID) | ORAL | Status: DC

## 2013-02-04 MED ORDER — AMLODIPINE BESYLATE 10 MG PO TABS: 10.0000 mg | ORAL_TABLET | Freq: Every day | ORAL | Status: DC

## 2013-02-04 MED ORDER — FREESTYLE SYSTEM KIT: 1.0000 | PACK | Freq: Three times a day (TID) | Status: DC

## 2013-02-04 MED ORDER — ATORVASTATIN CALCIUM 10 MG PO TABS: 10.0000 mg | ORAL_TABLET | Freq: Every day | ORAL | Status: DC

## 2013-02-04 MED ORDER — INSULIN NPH ISOPHANE & REGULAR (70-30) 100 UNIT/ML ~~LOC~~ SUSP: 10.0000 [IU] | Freq: Two times a day (BID) | SUBCUTANEOUS | Status: DC

## 2013-02-04 MED ORDER — METFORMIN HCL 1000 MG PO TABS: 1000.0000 mg | ORAL_TABLET | Freq: Two times a day (BID) | ORAL | Status: DC

## 2013-02-04 NOTE — Progress Notes (Signed)
Patient here for follow up on DM Blood sugar 300

## 2013-02-04 NOTE — Patient Instructions (Signed)
Accuchecks 4 times/day, Once in AM empty stomach and then before each meal. Log in all results and show them to your Prim.MD in 2 weeks. If any glucose reading is under 80 or above 300 call your Prim MD immidiately. Follow Low glucose instructions for glucose under 80 as instructed.

## 2013-02-04 NOTE — Progress Notes (Signed)
Patient ID: Meghan Finley, female   DOB: [birth year], 42 y.o.   MRN: [id number]  Patient Demographics  Meghan Finley, is a 42 y.o. female  YQM:[id number]  [id number]  DOB - 05-23-1970  Chief Complaint  Patient presents with  . Follow-up  . Diabetes        Subjective:   Meghan Finley with History of morbid obesity, type 2 diabetes mellitus, is here for a followup visit no subjective complaints, her sugars were elevated in the office in the range of 381 she was in excess of 9.  Denies any subjective complaints except as above, no active headache, no chest abdominal pain at this time, not short of breath. No focal weakness which is new.   Objective:    Patient Active Problem List   Diagnosis Date Noted  . Fibroids 01/28/2013  . Menorrhagia 01/28/2013  . Anemia 01/28/2013  . Dysmenorrhea 01/28/2013  . Diabetes 11/05/2012  . Depression (emotion) 11/05/2012  . Menorrhagia, premenopausal 11/05/2012  . Low back pain 11/05/2012  . DIABETES MELLITUS, TYPE II 08/03/2006  . DEPRESSION 08/03/2006     Filed Vitals:   02/04/13 0942  BP: 140/76  Pulse: 86  Temp: 97.8 F (36.6 C)  Resp: 16  Height: 5\' 6"  (1.676 m)  Weight: 280 lb 6.4 oz (127.189 kg)  SpO2: 100%     Exam   Awake Alert, Oriented X 3, No new F.N deficits, Normal affect .AT,PERRAL Supple Neck,No JVD, No cervical lymphadenopathy appriciated.  Symmetrical Chest wall movement, Good air movement bilaterally, CTAB RRR,No Gallops,Rubs or new Murmurs, No Parasternal Heave +ve B.Sounds, Abd Soft, Non tender, No organomegaly appriciated, No rebound - guarding or rigidity. No Cyanosis, Clubbing or edema, No new Rash or bruise       Data Review   Lab Results  Component Value Date   WBC 6.5 11/02/2012   HGB 11.9* 11/02/2012   HCT 35.0* 11/02/2012   MCV 76.1* 11/02/2012   PLT 341 11/02/2012      Chemistry      Component Value Date/Time   NA 135 11/05/2012 1316   K 4.8 11/05/2012 1316   CL 100 11/05/2012 1316    CO2 24 11/05/2012 1316   BUN 9 11/05/2012 1316   CREATININE 0.65 11/05/2012 1316   CREATININE 0.80 11/02/2012 0345      Component Value Date/Time   CALCIUM 9.5 11/05/2012 1316   ALKPHOS 59 11/05/2012 1316   AST 14 11/05/2012 1316   ALT 12 11/05/2012 1316   BILITOT 0.4 11/05/2012 1316       Lab Results  Component Value Date   HGBA1C 9.5% 02/04/2013    Lab Results  Component Value Date   CHOL 256* 11/05/2012   HDL 53 11/05/2012   LDLCALC 178* 11/05/2012   TRIG 124 11/05/2012   CHOLHDL 4.8 11/05/2012    Lab Results  Component Value Date   TSH 1.820 11/05/2012    No results found for this basename: PSA      Prior to Admission medications   Medication Sig Start Date End Date Taking? Authorizing Provider  acetaminophen (TYLENOL) 500 MG tablet Take 500 mg by mouth every 6 (six) hours as needed for pain.   Yes Historical Provider, MD  glipiZIDE (GLUCOTROL) 5 MG tablet Take 1 tablet (5 mg total) by mouth 2 (two) times daily before a meal. 02/04/13  Yes Leroy Sea, MD  venlafaxine XR (EFFEXOR-XR) 150 MG 24 hr capsule Take 150 mg by mouth daily with breakfast.  Yes Historical Provider, MD  amLODipine (NORVASC) 10 MG tablet Take 1 tablet (10 mg total) by mouth daily. 02/04/13   Leroy Sea, MD  aspirin 81 MG tablet Take 81 mg by mouth as needed for pain.    Historical Provider, MD  glucose monitoring kit (FREESTYLE) monitoring kit 1 each by Does not apply route 4 (four) times daily - after meals and at bedtime. 1 month Diabetic Testing Supplies for QAC-QHS accuchecks. Switch to any brand 02/04/13   Leroy Sea, MD  Ibuprofen-Diphenhydramine HCl (ADVIL PM) 200-25 MG CAPS Take 1 tablet by mouth at bedtime as needed (sleep/pain).    Historical Provider, MD  insulin NPH-regular (NOVOLIN 70/30) (70-30) 100 UNIT/ML injection Inject 10 Units into the skin 2 (two) times daily with a meal. Dispense syringes and needles as needed for twice a day use one month supply 1 refill 02/04/13    Leroy Sea, MD  metFORMIN (GLUCOPHAGE) 1000 MG tablet Take 1 tablet (1,000 mg total) by mouth 2 (two) times daily with a meal. 02/04/13   Leroy Sea, MD  misoprostol (CYTOTEC) 200 MCG tablet Take 3 pills by mouth the night before endometrial biopsy. 01/28/13   Allie Bossier, MD  sertraline (ZOLOFT) 50 MG tablet Take 1 tablet (50 mg total) by mouth daily. 11/05/12   Jeanann Lewandowsky, MD  sodium-potassium bicarbonate (ALKA-SELTZER GOLD) TBEF dissolvable tablet Take 1 tablet by mouth 2 (two) times daily as needed (allergy symptoms).    Historical Provider, MD     Assessment & Plan    DM II. Improved control. Have increased her Glucophage, continue Glucotrol at present dose, and low-dose 7030 insulin. Glucometer testing supplies given, requested to maintain a log of her Accu-Cheks q. a.c. at bedtime and bring it here in 2 weeks. Ophthalmology referral made for routine eye exam.   Hypertension. Will add Norvasc.   Dyslipidemia low-dose statin started counseled that if she is getting pregnant or wants to get pregnant to inform us.     Routine health maintenance.  She has refused flu shot, normal Pap smear recently   Leroy Sea M.D on 02/04/2013 at 10:09 AM

## 2013-02-11 ENCOUNTER — Other Ambulatory Visit: Payer: Self-pay

## 2013-02-11 MED ORDER — INSULIN NPH ISOPHANE & REGULAR (70-30) 100 UNIT/ML ~~LOC~~ SUSP: 10.0000 [IU] | Freq: Two times a day (BID) | SUBCUTANEOUS | Status: DC

## 2013-02-11 MED ORDER — FREESTYLE SYSTEM KIT: 1.0000 | PACK | Freq: Three times a day (TID) | Status: DC

## 2013-02-23 ENCOUNTER — Ambulatory Visit (INDEPENDENT_AMBULATORY_CARE_PROVIDER_SITE_OTHER): Payer: No Typology Code available for payment source | Admitting: Obstetrics & Gynecology

## 2013-02-23 ENCOUNTER — Encounter: Payer: Self-pay | Admitting: Obstetrics & Gynecology

## 2013-02-23 ENCOUNTER — Other Ambulatory Visit (HOSPITAL_COMMUNITY)
Admission: RE | Admit: 2013-02-23 | Discharge: 2013-02-23 | Disposition: A | Discharge status: Home / Self Care | Payer: No Typology Code available for payment source | Source: Ambulatory Visit | Attending: Obstetrics & Gynecology | Admitting: Obstetrics & Gynecology

## 2013-02-23 VITALS — BP 148/90 | HR 92 | Temp 98.1°F | Ht 64.0 in | Wt 274.1 lb

## 2013-02-23 DIAGNOSIS — D259 Leiomyoma of uterus, unspecified: Secondary | ICD-10-CM

## 2013-02-23 DIAGNOSIS — N949 Unspecified condition associated with female genital organs and menstrual cycle: Secondary | ICD-10-CM

## 2013-02-23 DIAGNOSIS — D219 Benign neoplasm of connective and other soft tissue, unspecified: Secondary | ICD-10-CM

## 2013-02-23 DIAGNOSIS — D649 Anemia, unspecified: Secondary | ICD-10-CM

## 2013-02-23 DIAGNOSIS — N938 Other specified abnormal uterine and vaginal bleeding: Secondary | ICD-10-CM | POA: Insufficient documentation

## 2013-02-23 DIAGNOSIS — N920 Excessive and frequent menstruation with regular cycle: Secondary | ICD-10-CM

## 2013-02-23 DIAGNOSIS — Z01812 Encounter for preprocedural laboratory examination: Secondary | ICD-10-CM

## 2013-02-23 LAB — POCT PREGNANCY, URINE: Preg Test, Ur: NEGATIVE

## 2013-02-23 LAB — CBC
Hemoglobin: 10.7 g/dL — ABNORMAL LOW (ref 12.0–15.0)
MCH: 23.9 pg — ABNORMAL LOW (ref 26.0–34.0)
MCHC: 31.2 g/dL (ref 30.0–36.0)
Platelets: 427 10*3/uL — ABNORMAL HIGH (ref 150–400)
RDW: 15.6 % — ABNORMAL HIGH (ref 11.5–15.5)

## 2013-02-23 NOTE — Progress Notes (Signed)
   Subjective:    Patient ID: Meghan Finley, female    DOB: Feb 24, 1971, 42 y.o.   MRN: [id number]  HPI This 42 yo morbidly obese nullipara is here today for her EMBX. She took cytotec last night and did experience some cramping.   Review of Systems     Objective:   Physical Exam   UPT negative, consent signed, time out done Cervix prepped with betadine and grasped with a single tooth tenaculum Uterus sounded to 9 cm Pipelle used for 2 passes with a large amount of tissue obtained. She tolerated the procedure well.       Assessment & Plan:  Menorrhagia- check CBC and await EMBX results RTC 2 weeks for results and recommendations She declines a flu vaccine today.

## 2013-02-25 ENCOUNTER — Encounter: Payer: Self-pay | Admitting: *Deleted

## 2013-03-11 ENCOUNTER — Ambulatory Visit: Payer: Self-pay | Admitting: Obstetrics & Gynecology

## 2013-03-11 ENCOUNTER — Telehealth: Payer: Self-pay | Admitting: *Deleted

## 2013-03-11 NOTE — Telephone Encounter (Addendum)
Pt left message stating that she needs to cancel her appt today due to work conflict. She wants to receive her results by phone and also wants to know if she needs to re-schedule her appt.  I discussed pt's results with Dr. Marice Potter and called her back. I left a message stating that I can review her results with her. Please leave a new message and state whether detailed information may be left on her voice mail or if we need to speak to her directly.  **Note:  Pt may be informed that her biopsy was negative and she has mild anemia. If she wants to know treatment options for her menorrhagia, she will need to reschedule clinic follow up appt.

## 2013-03-11 NOTE — Telephone Encounter (Signed)
Patient called and left message stating she received a missed call from this number and would like for someone to call her back before we leave for the day. Called patient, no answer- left message stating I am returning your phone call, please call us back at the clinics

## 2013-03-12 NOTE — Telephone Encounter (Signed)
Called patient, no answer- left message stating we are trying to reach you from the other day, please call us back and let us know if we can leave detailed information on your voicemail.

## 2013-03-12 NOTE — Telephone Encounter (Signed)
Called and spoke w/pt. I informed her of test results showing mild anemia and negative endometrial biopsy. I advised her that if she wants to discuss treatment options for her abnormal bleeding, she will need to reschedule her clinic appt.  She stated that she would like to discuss her options and to gain more information as to the cause of her bleeding. She requests a morning or early afternoon appt. I advised her that she will receive a call from our scheduling staff regarding an appt. She voiced understanding.

## 2013-03-16 ENCOUNTER — Other Ambulatory Visit: Payer: Self-pay | Admitting: Obstetrics & Gynecology

## 2013-03-16 ENCOUNTER — Ambulatory Visit (HOSPITAL_COMMUNITY)
Admission: RE | Admit: 2013-03-16 | Discharge: 2013-03-16 | Disposition: A | Discharge status: Home / Self Care | Payer: No Typology Code available for payment source | Source: Ambulatory Visit | Attending: Obstetrics & Gynecology | Admitting: Obstetrics & Gynecology

## 2013-03-16 DIAGNOSIS — Z1231 Encounter for screening mammogram for malignant neoplasm of breast: Secondary | ICD-10-CM

## 2013-03-16 DIAGNOSIS — Z0000 Encounter for general adult medical examination without abnormal findings: Secondary | ICD-10-CM

## 2013-04-03 ENCOUNTER — Ambulatory Visit: Payer: No Typology Code available for payment source | Admitting: Obstetrics & Gynecology

## 2013-09-21 ENCOUNTER — Telehealth: Payer: Self-pay | Admitting: Internal Medicine

## 2013-09-21 NOTE — Telephone Encounter (Signed)
Returning pt's call regarding appt. LVM

## 2013-10-20 ENCOUNTER — Encounter: Payer: Self-pay | Admitting: Internal Medicine

## 2013-10-20 ENCOUNTER — Ambulatory Visit: Payer: Self-pay | Attending: Internal Medicine | Admitting: Internal Medicine

## 2013-10-20 VITALS — BP 135/81 | HR 97 | Temp 98.5°F | Resp 20 | Ht 64.0 in | Wt 270.6 lb

## 2013-10-20 DIAGNOSIS — I10 Essential (primary) hypertension: Secondary | ICD-10-CM | POA: Insufficient documentation

## 2013-10-20 DIAGNOSIS — R739 Hyperglycemia, unspecified: Secondary | ICD-10-CM

## 2013-10-20 DIAGNOSIS — Z7982 Long term (current) use of aspirin: Secondary | ICD-10-CM | POA: Insufficient documentation

## 2013-10-20 DIAGNOSIS — E119 Type 2 diabetes mellitus without complications: Secondary | ICD-10-CM

## 2013-10-20 DIAGNOSIS — R7309 Other abnormal glucose: Secondary | ICD-10-CM

## 2013-10-20 DIAGNOSIS — F411 Generalized anxiety disorder: Secondary | ICD-10-CM | POA: Insufficient documentation

## 2013-10-20 DIAGNOSIS — Z794 Long term (current) use of insulin: Secondary | ICD-10-CM | POA: Insufficient documentation

## 2013-10-20 DIAGNOSIS — Z713 Dietary counseling and surveillance: Secondary | ICD-10-CM | POA: Insufficient documentation

## 2013-10-20 DIAGNOSIS — F329 Major depressive disorder, single episode, unspecified: Secondary | ICD-10-CM | POA: Insufficient documentation

## 2013-10-20 DIAGNOSIS — Z0000 Encounter for general adult medical examination without abnormal findings: Secondary | ICD-10-CM

## 2013-10-20 DIAGNOSIS — Z23 Encounter for immunization: Secondary | ICD-10-CM

## 2013-10-20 DIAGNOSIS — D649 Anemia, unspecified: Secondary | ICD-10-CM | POA: Insufficient documentation

## 2013-10-20 DIAGNOSIS — F3289 Other specified depressive episodes: Secondary | ICD-10-CM | POA: Insufficient documentation

## 2013-10-20 DIAGNOSIS — E785 Hyperlipidemia, unspecified: Secondary | ICD-10-CM | POA: Insufficient documentation

## 2013-10-20 LAB — POCT URINALYSIS DIPSTICK
BILIRUBIN UA: NEGATIVE
Glucose, UA: 500
Ketones, UA: NEGATIVE
Leukocytes, UA: NEGATIVE
NITRITE UA: NEGATIVE
PH UA: 5.5
Protein, UA: NEGATIVE
Spec Grav, UA: 1.015
Urobilinogen, UA: 0.2

## 2013-10-20 LAB — POCT GLYCOSYLATED HEMOGLOBIN (HGB A1C): HEMOGLOBIN A1C: 10.6

## 2013-10-20 LAB — GLUCOSE, POCT (MANUAL RESULT ENTRY): POC GLUCOSE: 401 mg/dL — AB (ref 70–99)

## 2013-10-20 MED ORDER — INSULIN ASPART 100 UNIT/ML ~~LOC~~ SOLN
20.0000 [IU] | Freq: Once | SUBCUTANEOUS | Status: AC
  Administered 2013-10-20: 20 [IU] via SUBCUTANEOUS

## 2013-10-20 MED ORDER — METFORMIN HCL 1000 MG PO TABS: 1000.0000 mg | ORAL_TABLET | Freq: Two times a day (BID) | ORAL | Status: DC

## 2013-10-20 MED ORDER — INSULIN GLARGINE 100 UNIT/ML SOLOSTAR PEN: 10.0000 [IU] | PEN_INJECTOR | Freq: Every day | SUBCUTANEOUS | Status: DC

## 2013-10-20 MED ORDER — GLIPIZIDE 5 MG PO TABS: 5.0000 mg | ORAL_TABLET | Freq: Two times a day (BID) | ORAL | Status: DC

## 2013-10-20 MED ORDER — LISINOPRIL 2.5 MG PO TABS: 2.5000 mg | ORAL_TABLET | Freq: Every day | ORAL | Status: DC

## 2013-10-20 MED ORDER — ATORVASTATIN CALCIUM 10 MG PO TABS: 10.0000 mg | ORAL_TABLET | Freq: Every day | ORAL | Status: DC

## 2013-10-20 NOTE — Progress Notes (Signed)
Patient ID: Meghan Finley, female   DOB: 1971/03/09, 43 y.o.   MRN: [id number]  CC: DM  HPI:  Patient presents today for a follow up of diabetes.  Patient reports that she has not been checking her sugars and stopped taking all her medications over two months ago.  Patient reports that she has only been taking the 70/30 premix insulin as needed. Patient reports that she knows she can do better she just needs help with managing her diet.    No Known Allergies Past Medical History  Diagnosis Date  . Diabetes mellitus   . Anemia   . Anxiety   . Depression   . Morbid obesity   . Hyperlipidemia   . Hypertension    Current Outpatient Prescriptions on File Prior to Visit  Medication Sig Dispense Refill  . acetaminophen (TYLENOL) 500 MG tablet Take 500 mg by mouth every 6 (six) hours as needed for pain.      Marland Kitchen insulin NPH-regular (NOVOLIN 70/30) (70-30) 100 UNIT/ML injection Inject 10 Units into the skin 2 (two) times daily with a meal. Dispense syringes and needles as needed for twice a day use one month supply 1 refill  10 mL  1  . venlafaxine XR (EFFEXOR-XR) 150 MG 24 hr capsule Take 150 mg by mouth daily with breakfast.      . amLODipine (NORVASC) 10 MG tablet Take 1 tablet (10 mg total) by mouth daily.  30 tablet  3  . aspirin 81 MG tablet Take 81 mg by mouth as needed for pain.      Marland Kitchen atorvastatin (LIPITOR) 10 MG tablet Take 1 tablet (10 mg total) by mouth daily. switch to any statin  30 tablet  3  . glipiZIDE (GLUCOTROL) 5 MG tablet Take 1 tablet (5 mg total) by mouth 2 (two) times daily before a meal.  60 tablet  3  . glucose monitoring kit (FREESTYLE) monitoring kit 1 each by Does not apply route 4 (four) times daily - after meals and at bedtime. 1 month Diabetic Testing Supplies for QAC-QHS accuchecks. Switch to any brand  1 each  1  . Ibuprofen-Diphenhydramine HCl (ADVIL PM) 200-25 MG CAPS Take 1 tablet by mouth at bedtime as needed (sleep/pain).      . metFORMIN (GLUCOPHAGE) 1000 MG  tablet Take 1 tablet (1,000 mg total) by mouth 2 (two) times daily with a meal.  60 tablet  5  . misoprostol (CYTOTEC) 200 MCG tablet Take 3 pills by mouth the night before endometrial biopsy.  3 tablet  0  . sertraline (ZOLOFT) 50 MG tablet Take 1 tablet (50 mg total) by mouth daily.  30 tablet  3  . sodium-potassium bicarbonate (ALKA-SELTZER GOLD) TBEF dissolvable tablet Take 1 tablet by mouth 2 (two) times daily as needed (allergy symptoms).       No current facility-administered medications on file prior to visit.   Family History  Problem Relation Age of Onset  . Heart disease Mother   . Diabetes Mother   . Heart disease Father   . Asthma Sister   . Diabetes Sister    History   Social History  . Marital Status: Single    Spouse Name: N/A    Number of Children: N/A  . Years of Education: N/A   Occupational History  . Not on file.   Social History Main Topics  . Smoking status: Never Smoker   . Smokeless tobacco: Never Used  . Alcohol Use: 1.0 oz/week  2 drink(s) per week     Comment: 1-2 glasses wine   . Drug Use: No  . Sexual Activity: Yes    Partners: Male    Birth Control/ Protection: None   Other Topics Concern  . Not on file   Social History Narrative  . No narrative on file   Review of Systems  Constitutional: Negative.   Eyes: Positive for blurred vision.  Respiratory: Negative.   Cardiovascular: Positive for leg swelling. Negative for chest pain, palpitations, orthopnea and PND.  Genitourinary: Positive for frequency. Negative for dysuria.  Musculoskeletal: Negative.   Neurological: Positive for tingling (for past month in hands and feet). Negative for dizziness and headaches.  Endo/Heme/Allergies: Positive for polydipsia.   .    Objective:   Filed Vitals:   10/20/13 1500  BP: 135/81  Pulse: 97  Temp: 98.5 F (36.9 C)  Resp: 20    Physical Exam: Constitutional: Patient appears well-developed and well-nourished. No distress. HENT:  Normocephalic, atraumatic, External right and left ear normal. Oropharynx is clear and moist.  Eyes: Conjunctivae and EOM are normal. PERRLA, no scleral icterus. Neck: Normal ROM. Neck supple. No JVD. No tracheal deviation. No thyromegaly. CVS: RRR, S1/S2 +, no murmurs, no gallops, no carotid bruit.  Pulmonary: Effort and breath sounds normal, no stridor, rhonchi, wheezes, rales.  Abdominal: Soft. BS +,  no distension, tenderness, rebound or guarding.  Musculoskeletal: Normal range of motion. No edema and no tenderness.  Lymphadenopathy: No lymphadenopathy noted, cervical, inguinal or axillary Neuro: Alert. Normal reflexes, muscle tone coordination. No cranial nerve deficit. Skin: Skin is warm and dry. No rash noted. Not diaphoretic. No erythema. No pallor. BLE are without ulcers Psychiatric: Normal mood and affect. Behavior, judgment, thought content normal.   Lab Results  Component Value Date   WBC 6.6 02/23/2013   HGB 10.7* 02/23/2013   HCT 34.3* 02/23/2013   MCV 76.6* 02/23/2013   PLT 427* 02/23/2013   Lab Results  Component Value Date   CREATININE 0.65 11/05/2012   BUN 9 11/05/2012   NA 135 11/05/2012   K 4.8 11/05/2012   CL 100 11/05/2012   CO2 24 11/05/2012    Lab Results  Component Value Date   HGBA1C 10.6 10/20/2013   Lipid Panel     Component Value Date/Time   CHOL 256* 11/05/2012 1316   TRIG 124 11/05/2012 1316   HDL 53 11/05/2012 1316   CHOLHDL 4.8 11/05/2012 1316   VLDL 25 11/05/2012 1316   LDLCALC 178* 11/05/2012 1316       Assessment and plan:   Lexani was seen today for annual exam.  Diagnoses and associated orders for this visit:  Type 2 diabetes mellitus without complication - Glucose (CBG) - HgB A1c - Amb Referral to Nutrition and Diabetic E - Ambulatory referral to Ophthalmology - metFORMIN (GLUCOPHAGE) 1000 MG tablet; Take 1 tablet (1,000 mg total) by mouth 2 (two) times daily with a meal. - glipiZIDE (GLUCOTROL) 5 MG tablet; Take 1 tablet (5 mg total) by  mouth 2 (two) times daily before a meal. - Insulin Glargine (LANTUS SOLOSTAR) 100 UNIT/ML Solostar Pen; Inject 10 Units into the skin daily at 10 pm. Will need to check BS 4 times per day - atorvastatin (LIPITOR) 10 MG tablet; Take 1 tablet (10 mg total) by mouth daily. - lisinopril (ZESTRIL) 2.5 MG tablet; Take 1 tablet (2.5 mg total) by mouth daily. - Lipid panel; Future - CBC; Future - COMPLETE METABOLIC PANEL WITH GFR; Future - TSH;  Future - Vit D  25 hydroxy (rtn osteoporosis monitoring); Future  Hyperglycemia - insulin aspart (novoLOG) injection 20 Units; Inject 0.2 mLs (20 Units total) into the skin once. - Urinalysis Dipstick - Glucose (CBG)  Annual physical exam - Tdap vaccine greater than or equal to 7yo IM   Return for 2 weeks/4 weeks RN and 3 mo PCP. Nurse will show me CBG log and medication changes will be made to my discretion at time of nurse visit.       Chari Manning, NP-C [facility] [phone] 10/20/2013, 3:32 PM

## 2013-10-20 NOTE — Patient Instructions (Signed)
Exercise to Lose Weight Exercise and a healthy diet may help you lose weight. Your doctor may suggest specific exercises. EXERCISE IDEAS AND TIPS  Choose low-cost things you enjoy doing, such as walking, bicycling, or exercising to workout videos.  Take stairs instead of the elevator.  Walk during your lunch break.  Park your car further away from work or school.  Go to a gym or an exercise class.  Start with 5 to 10 minutes of exercise each day. Build up to 30 minutes of exercise 4 to 6 days a week.  Wear shoes with good support and comfortable clothes.  Stretch before and after working out.  Work out until you breathe harder and your heart beats faster.  Drink extra water when you exercise.  Do not do so much that you hurt yourself, feel dizzy, or get very short of breath. Exercises that burn about 150 calories:  Running 1  miles in 15 minutes.  Playing volleyball for 45 to 60 minutes.  Washing and waxing a car for 45 to 60 minutes.  Playing touch football for 45 minutes.  Walking 1  miles in 35 minutes.  Pushing a stroller 1  miles in 30 minutes.  Playing basketball for 30 minutes.  Raking leaves for 30 minutes.  Bicycling 5 miles in 30 minutes.  Walking 2 miles in 30 minutes.  Dancing for 30 minutes.  Shoveling snow for 15 minutes.  Swimming laps for 20 minutes.  Walking up stairs for 15 minutes.  Bicycling 4 miles in 15 minutes.  Gardening for 30 to 45 minutes.  Jumping rope for 15 minutes.  Washing windows or floors for 45 to 60 minutes. Document Released: 04/14/2010 Document Revised: 06/04/2011 Document Reviewed: 04/14/2010 [facility] Patient Information 2015 [place], [place]. This information is not intended to replace advice given to you by your health care provider. Make sure you discuss any questions you have with your health care provider. Diabetes and Exercise Exercising regularly is important. It is not just about losing weight. It has  many health benefits, such as:  Improving your overall fitness, flexibility, and endurance.  Increasing your bone density.  Helping with weight control.  Decreasing your body fat.  Increasing your muscle strength.  Reducing stress and tension.  Improving your overall health. People with diabetes who exercise gain additional benefits because exercise:  Reduces appetite.  Improves the body's use of blood sugar (glucose).  Helps lower or control blood glucose.  Decreases blood pressure.  Helps control blood lipids (such as cholesterol and triglycerides).  Improves the body's use of the hormone insulin by:  Increasing the body's insulin sensitivity.  Reducing the body's insulin needs.  Decreases the risk for heart disease because exercising:  Lowers cholesterol and triglycerides levels.  Increases the levels of good cholesterol (such as high-density lipoproteins [HDL]) in the body.  Lowers blood glucose levels. YOUR ACTIVITY PLAN  Choose an activity that you enjoy and set realistic goals. Your health care provider or diabetes educator can help you make an activity plan that works for you. Exercise regularly as directed by your health care provider. This includes:  Performing resistance training twice a week such as push-ups, sit-ups, lifting weights, or using resistance bands.  Performing 150 minutes of cardio exercises each week such as walking, running, or playing sports.  Staying active and spending no more than 90 minutes at one time being inactive. Even short bursts of exercise are good for you. Three 10-minute sessions spread throughout the day are just  as beneficial as a single 30-minute session. Some exercise ideas include:  Taking the dog for a walk.  Taking the stairs instead of the elevator.  Dancing to your favorite song.  Doing an exercise video.  Doing your favorite exercise with a friend. RECOMMENDATIONS FOR EXERCISING WITH TYPE 1 OR TYPE 2  DIABETES   Check your blood glucose before exercising. If blood glucose levels are greater than 240 mg/dL, check for urine ketones. Do not exercise if ketones are present.  Avoid injecting insulin into areas of the body that are going to be exercised. For example, avoid injecting insulin into:  The arms when playing tennis.  The legs when jogging.  Keep a record of:  Food intake before and after you exercise.  Expected peak times of insulin action.  Blood glucose levels before and after you exercise.  The type and amount of exercise you have done.  Review your records with your health care provider. Your health care provider will help you to develop guidelines for adjusting food intake and insulin amounts before and after exercising.  If you take insulin or oral hypoglycemic agents, watch for signs and symptoms of hypoglycemia. They include:  Dizziness.  Shaking.  Sweating.  Chills.  Confusion.  Drink plenty of water while you exercise to prevent dehydration or heat stroke. Body water is lost during exercise and must be replaced.  Talk to your health care provider before starting an exercise program to make sure it is safe for you. Remember, almost any type of activity is better than none. Document Released: 06/02/2003 Document Revised: 07/27/2013 Document Reviewed: 08/19/2012 [facility] Patient Information 2015 [place], [place]. This information is not intended to replace advice given to you by your health care provider. Make sure you discuss any questions you have with your health care provider.

## 2013-10-20 NOTE — Progress Notes (Signed)
Patient presents for work physical. Brought paperwork to be filled out. Requesting med refills for insulin and metformin  States has run out or stopped taking most meds  C/O 1 month history of bilateral hand tightness and  Pain under great toe on both feet also for 1 month. States never started amlodipine as prescribed 01/2013 States sees counselor for depression

## 2013-10-30 ENCOUNTER — Ambulatory Visit: Payer: Self-pay | Attending: Internal Medicine

## 2013-11-02 ENCOUNTER — Ambulatory Visit (INDEPENDENT_AMBULATORY_CARE_PROVIDER_SITE_OTHER): Payer: Self-pay | Admitting: Home Health Services

## 2013-11-02 DIAGNOSIS — E119 Type 2 diabetes mellitus without complications: Secondary | ICD-10-CM

## 2013-11-02 NOTE — Progress Notes (Signed)
DIABETES Pt came in to have a retinal scan per diabetic care.   Image was taken and submitted to [facility]-DR. Cathren Laine for reading.    Results will be available in 1-2 weeks.  Results will be given to PCP for review and to contact patient.  Vinnie Level

## 2013-11-03 ENCOUNTER — Ambulatory Visit: Payer: Self-pay | Attending: Internal Medicine

## 2013-11-03 DIAGNOSIS — E119 Type 2 diabetes mellitus without complications: Secondary | ICD-10-CM

## 2013-11-03 LAB — COMPLETE METABOLIC PANEL WITH GFR
ALT: 12 U/L (ref 0–35)
AST: 11 U/L (ref 0–37)
Albumin: 4.2 g/dL (ref 3.5–5.2)
Alkaline Phosphatase: 67 U/L (ref 39–117)
BILIRUBIN TOTAL: 0.3 mg/dL (ref 0.2–1.2)
BUN: 9 mg/dL (ref 6–23)
CO2: 23 meq/L (ref 19–32)
Calcium: 9.3 mg/dL (ref 8.4–10.5)
Chloride: 100 mEq/L (ref 96–112)
Creat: 0.63 mg/dL (ref 0.50–1.10)
GFR, Est African American: >89 mL/min
GFR, Est Non African American: >89 mL/min
Glucose, Bld: 226 mg/dL — ABNORMAL HIGH (ref 70–99)
Potassium: 4.3 mEq/L (ref 3.5–5.3)
Sodium: 134 mEq/L — ABNORMAL LOW (ref 135–145)
Total Protein: 6.9 g/dL (ref 6.0–8.3)

## 2013-11-03 LAB — LIPID PANEL
CHOLESTEROL: 153 mg/dL (ref 0–200)
HDL: 48 mg/dL (ref >39)
LDL Cholesterol: 93 mg/dL (ref 0–99)
Total CHOL/HDL Ratio: 3.2 Ratio
Triglycerides: 59 mg/dL (ref <150)
VLDL: 12 mg/dL (ref 0–40)

## 2013-11-03 LAB — CBC
HEMATOCRIT: 31.1 % — AB (ref 36.0–46.0)
Hemoglobin: 9.5 g/dL — ABNORMAL LOW (ref 12.0–15.0)
MCH: 21 pg — AB (ref 26.0–34.0)
MCHC: 30.5 g/dL (ref 30.0–36.0)
MCV: 68.8 fL — AB (ref 78.0–100.0)
PLATELETS: 475 10*3/uL — AB (ref 150–400)
RBC: 4.52 MIL/uL (ref 3.87–5.11)
RDW: 18.4 % — AB (ref 11.5–15.5)
WBC: 6.6 10*3/uL (ref 4.0–10.5)

## 2013-11-04 LAB — VITAMIN D 25 HYDROXY (VIT D DEFICIENCY, FRACTURES): VIT D 25 HYDROXY: 34 ng/mL (ref 30–89)

## 2013-11-04 LAB — TSH: TSH: 3.064 u[IU]/mL (ref 0.350–4.500)

## 2013-11-05 ENCOUNTER — Telehealth: Payer: Self-pay | Admitting: Emergency Medicine

## 2013-11-05 MED ORDER — FERROUS SULFATE 325 (65 FE) MG PO TABS: 325.0000 mg | ORAL_TABLET | Freq: Every day | ORAL | Status: DC

## 2013-11-05 NOTE — Telephone Encounter (Signed)
Message copied by Ricci Barker on Thu Nov 05, 2013  3:40 PM ------      Message from: Chari Manning A      Created: Wed Nov 04, 2013  2:29 PM       All labs are normal except it reveals that she has iron deficiency anemia. Please send ferrous sulfate 325 mg TID, 90 tablets with 3 refills. Advise to drink more water and she may possibly need a stool sofetner to help with constipation.  Advise to take with food. ------

## 2013-11-05 NOTE — Telephone Encounter (Signed)
Pt given lab results with medication instructions to start taking prescribed ferrous Sulfate as well as drinking plenty of water/orange juice Pt verbalized understanding Pt states since insulin change 2 weeks ago, she is having loose stools Instructed pt to try OTC Lomotil for sx;s, if no improvement by Monday come in clinic during walk in hours

## 2013-11-06 ENCOUNTER — Other Ambulatory Visit: Payer: Self-pay | Admitting: Internal Medicine

## 2013-11-06 DIAGNOSIS — E119 Type 2 diabetes mellitus without complications: Secondary | ICD-10-CM

## 2013-11-06 MED ORDER — INSULIN GLARGINE 100 UNIT/ML SOLOSTAR PEN: 10.0000 [IU] | PEN_INJECTOR | Freq: Every day | SUBCUTANEOUS | Status: DC

## 2013-11-17 ENCOUNTER — Ambulatory Visit: Payer: Self-pay | Attending: Internal Medicine | Admitting: *Deleted

## 2013-11-17 VITALS — BP 148/87 | HR 83 | Resp 16

## 2013-11-17 DIAGNOSIS — E119 Type 2 diabetes mellitus without complications: Secondary | ICD-10-CM

## 2013-11-17 DIAGNOSIS — E139 Other specified diabetes mellitus without complications: Secondary | ICD-10-CM

## 2013-11-17 LAB — GLUCOSE, POCT (MANUAL RESULT ENTRY): POC GLUCOSE: 138 mg/dL — AB (ref 70–99)

## 2013-11-17 NOTE — Progress Notes (Signed)
Patient here for CBG and BP check. Patient blood sugars have been running 88-174. Patient is testing blood sugar three times a day.

## 2013-11-17 NOTE — Patient Instructions (Signed)
Daily Diabetes Record Check your blood glucose (BG) as directed by your health care provider. Use this form to record your results as well as any diabetes medicines you take, including insulin. Checking your BG, recording it, and bringing your records to your health care provider is very helpful in managing your diabetes. These numbers help your health care provider know if any changes are needed to your diabetes plan.  Week of _____________________________ Date: _________  Elita Boone, BG/Medicines: ________________ / __________________________________________________________  LUNCH, BG/Medicines: ____________________ / __________________________________________________________  Wonda Cheng, BG/Medicines: ___________________ / __________________________________________________________  BEDTIME, BG/Medicines: __________________ / __________________________________________________________ Date: _________  Elita Boone, BG/Medicines: ________________ / __________________________________________________________  LUNCH, BG/Medicines: ____________________ / __________________________________________________________  Wonda Cheng, BG/Medicines: ___________________ / __________________________________________________________  BEDTIME, BG/Medicines: __________________ / __________________________________________________________ Date: _________  Elita Boone, BG/Medicines: ________________ / __________________________________________________________  LUNCH, BG/Medicines: ____________________ / __________________________________________________________  Wonda Cheng, BG/Medicines: ___________________ / __________________________________________________________  BEDTIME, BG/Medicines: __________________ / __________________________________________________________ Date: _________  Elita Boone, BG/Medicines: ________________ / __________________________________________________________  LUNCH, BG/Medicines:  ____________________ / __________________________________________________________  Wonda Cheng, BG/Medicines: ___________________ / __________________________________________________________  BEDTIME, BG/Medicines: __________________ / __________________________________________________________ Date: _________  Elita Boone, BG/Medicines: ________________ / __________________________________________________________  LUNCH, BG/Medicines: ____________________ / __________________________________________________________  Wonda Cheng, BG/Medicines: ___________________ / __________________________________________________________  BEDTIME, BG/Medicines: __________________ / __________________________________________________________ Date: _________  Elita Boone, BG/Medicines: ________________ / __________________________________________________________  LUNCH, BG/Medicines: ____________________ / __________________________________________________________  Wonda Cheng, BG/Medicines: ___________________ / __________________________________________________________  BEDTIME, BG/Medicines: __________________ / __________________________________________________________ Date: _________  Elita Boone, BG/Medicines: ________________ / __________________________________________________________  LUNCH, BG/Medicines: ____________________ / __________________________________________________________  Wonda Cheng, BG/Medicines: ___________________ / __________________________________________________________  BEDTIME, BG/Medicines: __________________ / __________________________________________________________ Notes: __________________________________________________________________________________________________ Document Released: 02/14/2004 Document Revised: 07/27/2013 Document Reviewed: 05/06/2013 [employer] Patient Information 2015 [employer]. This information is not intended to replace advice given to you by your health care  provider. Make sure you discuss any questions you have with your health care provider.

## 2013-11-18 ENCOUNTER — Ambulatory Visit: Payer: Self-pay

## 2013-11-19 ENCOUNTER — Encounter: Payer: Self-pay | Attending: Internal Medicine

## 2013-11-19 VITALS — Ht 64.0 in | Wt 274.0 lb

## 2013-11-19 DIAGNOSIS — Z794 Long term (current) use of insulin: Secondary | ICD-10-CM | POA: Insufficient documentation

## 2013-11-19 DIAGNOSIS — Z713 Dietary counseling and surveillance: Secondary | ICD-10-CM | POA: Insufficient documentation

## 2013-11-19 DIAGNOSIS — E119 Type 2 diabetes mellitus without complications: Secondary | ICD-10-CM | POA: Insufficient documentation

## 2013-11-19 NOTE — Progress Notes (Signed)
Patient was seen on 11/19/2013 for the first of a series of three diabetes self-management courses at [facility].  Patient Education Plan per assessed needs and concerns is to attend four course education program for Diabetes Self Management Education.  Current HbA1c: 10.6%  The following learning objectives were met by the patient during this class:  Describe diabetes  State some common risk factors for diabetes  Defines the role of glucose and insulin  Identifies type of diabetes and pathophysiology  Describe the relationship between diabetes and cardiovascular risk  State the members of the Healthcare Team  States the rationale for glucose monitoring  State when to test glucose  State their individual Target Range  State the importance of logging glucose readings  Describe how to interpret glucose readings  Identifies A1C target  Explain the correlation between A1c and eAG values  State symptoms and treatment of high blood glucose  State symptoms and treatment of low blood glucose  Explain proper technique for glucose testing  Identifies proper sharps disposal  Handouts given during class include:  Living Well with Diabetes book  Carb Counting and Meal Planning book  Meal Plan Card  Carbohydrate guide  Meal planning worksheet  Low Sodium Flavoring Tips  The diabetes portion plate  J1P to eAG Conversion Chart  Diabetes Medications  Diabetes Recommended Care Schedule  Support Group  Diabetes Success Plan  Core Class Satisfaction Survey  Follow-Up Plan:  Attend core 2

## 2013-11-26 ENCOUNTER — Encounter: Payer: Self-pay | Attending: Internal Medicine

## 2013-11-26 DIAGNOSIS — E119 Type 2 diabetes mellitus without complications: Secondary | ICD-10-CM | POA: Insufficient documentation

## 2013-11-26 DIAGNOSIS — Z713 Dietary counseling and surveillance: Secondary | ICD-10-CM | POA: Insufficient documentation

## 2013-11-26 DIAGNOSIS — Z794 Long term (current) use of insulin: Secondary | ICD-10-CM | POA: Insufficient documentation

## 2013-11-27 ENCOUNTER — Encounter: Payer: Self-pay | Admitting: Family Medicine

## 2013-11-27 NOTE — Progress Notes (Signed)

## 2013-12-03 DIAGNOSIS — E119 Type 2 diabetes mellitus without complications: Secondary | ICD-10-CM

## 2013-12-08 NOTE — Progress Notes (Signed)
Patient was seen on 12/03/13 for the third of a series of three diabetes self-management courses at the [facility]. The following learning objectives were met by the patient during this class:    State the amount of activity recommended for healthy living   Describe activities suitable for individual needs   Identify ways to regularly incorporate activity into daily life   Identify barriers to activity and ways to over come these barriers  Identify diabetes medications being personally used and their primary action for lowering glucose and possible side effects   Describe role of stress on blood glucose and develop strategies to address psychosocial issues   Identify diabetes complications and ways to prevent them  Explain how to manage diabetes during illness   Evaluate success in meeting personal goal   Establish 2-3 goals that they will plan to diligently work on until they return for the  87-month follow-up visit  Goals:  Follow Diabetes Meal Plan as instructed  Aim for 15-30 mins of physical activity daily as tolerated  Bring food record and glucose log to your follow up visit  Your patient has established the following 4 month goals in their individualized success plan: I will count my carb choices at most meals and snacks Read boxes for grams of carbs I will be active 20 minutes or more 3 times a week I will take my diabetes medications as scheduled I will eat less unhealthy fats by eating less pork I will test my glucose at least 2 times a day, 5 days a week I will look at patterns in my record book at least 4 days a month To help manage stress I will  read at least 2 times a week  Your patient has identified these potential barriers to change:  Motivation  Finances  Stress  Lack of family support  "It is the opposite support I receive from family"   Your patient has identified their diabetes self-care support plan as  [facility] Support  Group   Plan:  Attend Core 4 in 4 months

## 2014-02-23 ENCOUNTER — Other Ambulatory Visit: Payer: Self-pay | Admitting: Internal Medicine

## 2014-03-03 ENCOUNTER — Telehealth: Payer: Self-pay | Admitting: Emergency Medicine

## 2014-03-03 ENCOUNTER — Telehealth: Payer: Self-pay | Admitting: Internal Medicine

## 2014-03-03 ENCOUNTER — Other Ambulatory Visit: Payer: Self-pay | Admitting: *Deleted

## 2014-03-03 DIAGNOSIS — E119 Type 2 diabetes mellitus without complications: Secondary | ICD-10-CM

## 2014-03-03 MED ORDER — LISINOPRIL 2.5 MG PO TABS: 2.5000 mg | ORAL_TABLET | Freq: Every day | ORAL | Status: DC

## 2014-03-03 MED ORDER — METFORMIN HCL 1000 MG PO TABS
1000.0000 mg | ORAL_TABLET | Freq: Two times a day (BID) | ORAL | Status: DC
Start: 2014-03-03 — End: 2014-03-10

## 2014-03-03 MED ORDER — GLIPIZIDE 5 MG PO TABS: 5.0000 mg | ORAL_TABLET | Freq: Two times a day (BID) | ORAL | Status: DC

## 2014-03-03 MED ORDER — ATORVASTATIN CALCIUM 10 MG PO TABS: 10.0000 mg | ORAL_TABLET | Freq: Every day | ORAL | Status: DC

## 2014-03-03 NOTE — Telephone Encounter (Signed)
Medication refilled with appointment scheduled

## 2014-03-03 NOTE — Telephone Encounter (Signed)
Patient has come in today to request a refill on several medications; please f/u with patient about this request as she has run out; patient was made aware of the importance of keeping appointment f/u to maintain medications; please f/u with patient

## 2014-03-10 ENCOUNTER — Encounter: Payer: Self-pay | Admitting: Internal Medicine

## 2014-03-10 ENCOUNTER — Ambulatory Visit: Payer: BC Managed Care – PPO | Attending: Internal Medicine | Admitting: Internal Medicine

## 2014-03-10 VITALS — BP 129/83 | HR 99 | Temp 98.3°F | Resp 16 | Ht 64.0 in | Wt 269.0 lb

## 2014-03-10 DIAGNOSIS — Z794 Long term (current) use of insulin: Secondary | ICD-10-CM | POA: Diagnosis not present

## 2014-03-10 DIAGNOSIS — F329 Major depressive disorder, single episode, unspecified: Secondary | ICD-10-CM | POA: Diagnosis not present

## 2014-03-10 DIAGNOSIS — M79673 Pain in unspecified foot: Secondary | ICD-10-CM | POA: Diagnosis not present

## 2014-03-10 DIAGNOSIS — Z2821 Immunization not carried out because of patient refusal: Secondary | ICD-10-CM

## 2014-03-10 DIAGNOSIS — E119 Type 2 diabetes mellitus without complications: Secondary | ICD-10-CM | POA: Diagnosis not present

## 2014-03-10 DIAGNOSIS — M79671 Pain in right foot: Secondary | ICD-10-CM

## 2014-03-10 DIAGNOSIS — M79672 Pain in left foot: Secondary | ICD-10-CM

## 2014-03-10 DIAGNOSIS — F32A Depression, unspecified: Secondary | ICD-10-CM

## 2014-03-10 LAB — GLUCOSE, POCT (MANUAL RESULT ENTRY): POC Glucose: 188 mg/dl — AB (ref 70–99)

## 2014-03-10 LAB — POCT GLYCOSYLATED HEMOGLOBIN (HGB A1C): Hemoglobin A1C: 7.8

## 2014-03-10 MED ORDER — LISINOPRIL 2.5 MG PO TABS: 2.5000 mg | ORAL_TABLET | Freq: Every day | ORAL | Status: DC

## 2014-03-10 MED ORDER — PREDNISONE 20 MG PO TABS: 20.0000 mg | ORAL_TABLET | Freq: Every day | ORAL | Status: DC

## 2014-03-10 MED ORDER — INSULIN GLARGINE 100 UNIT/ML SOLOSTAR PEN: 10.0000 [IU] | PEN_INJECTOR | Freq: Every day | SUBCUTANEOUS | Status: DC

## 2014-03-10 MED ORDER — ATORVASTATIN CALCIUM 10 MG PO TABS: 10.0000 mg | ORAL_TABLET | Freq: Every day | ORAL | Status: DC

## 2014-03-10 MED ORDER — NAPROXEN 500 MG PO TABS: 500.0000 mg | ORAL_TABLET | Freq: Two times a day (BID) | ORAL | Status: DC

## 2014-03-10 MED ORDER — GLIPIZIDE 5 MG PO TABS: 5.0000 mg | ORAL_TABLET | Freq: Two times a day (BID) | ORAL | Status: DC

## 2014-03-10 MED ORDER — METFORMIN HCL 1000 MG PO TABS: 1000.0000 mg | ORAL_TABLET | Freq: Two times a day (BID) | ORAL | Status: DC

## 2014-03-10 NOTE — Progress Notes (Signed)
Pt is here following up on her HTN and diabetes. Pt reports that she is having swelling and pain in her hands and feet. Pt want to talk about her fibroids. Pt thinks that it is causing her to have her bowls locked up.

## 2014-03-10 NOTE — Patient Instructions (Signed)
DASH Eating Plan °DASH stands for "Dietary Approaches to Stop Hypertension." The DASH eating plan is a healthy eating plan that has been shown to reduce high blood pressure (hypertension). Additional health benefits may include reducing the risk of type 2 diabetes mellitus, heart disease, and stroke. The DASH eating plan may also help with weight loss. °WHAT DO I NEED TO KNOW ABOUT THE DASH EATING PLAN? °For the DASH eating plan, you will follow these general guidelines: °· Choose foods with a percent daily value for sodium of less than 5% (as listed on the food label). °· Use salt-free seasonings or herbs instead of table salt or sea salt. °· Check with your health care provider or pharmacist before using salt substitutes. °· Eat lower-sodium products, often labeled as "lower sodium" or "no salt added." °· Eat fresh foods. °· Eat more vegetables, fruits, and low-fat dairy products. °· Choose whole grains. Look for the word "whole" as the first word in the ingredient list. °· Choose fish and skinless chicken or turkey more often than red meat. Limit fish, poultry, and meat to 6 oz (170 g) each day. °· Limit sweets, desserts, sugars, and sugary drinks. °· Choose heart-healthy fats. °· Limit cheese to 1 oz (28 g) per day. °· Eat more home-cooked food and less restaurant, buffet, and fast food. °· Limit fried foods. °· Cook foods using methods other than frying. °· Limit canned vegetables. If you do use them, rinse them well to decrease the sodium. °· When eating at a restaurant, ask that your food be prepared with less salt, or no salt if possible. °WHAT FOODS CAN I EAT? °Seek help from a dietitian for individual calorie needs. °Grains °Whole grain or whole wheat bread. Brown rice. Whole grain or whole wheat pasta. Quinoa, bulgur, and whole grain cereals. Low-sodium cereals. Corn or whole wheat flour tortillas. Whole grain cornbread. Whole grain crackers. Low-sodium crackers. °Vegetables °Fresh or frozen vegetables  (raw, steamed, roasted, or grilled). Low-sodium or reduced-sodium tomato and vegetable juices. Low-sodium or reduced-sodium tomato sauce and paste. Low-sodium or reduced-sodium canned vegetables.  °Fruits °All fresh, canned (in natural juice), or frozen fruits. °Meat and Other Protein Products °Ground beef (85% or leaner), grass-fed beef, or beef trimmed of fat. Skinless chicken or turkey. Ground chicken or turkey. Pork trimmed of fat. All fish and seafood. Eggs. Dried beans, peas, or lentils. Unsalted nuts and seeds. Unsalted canned beans. °Dairy °Low-fat dairy products, such as skim or 1% milk, 2% or reduced-fat cheeses, low-fat ricotta or cottage cheese, or plain low-fat yogurt. Low-sodium or reduced-sodium cheeses. °Fats and Oils °Tub margarines without trans fats. Light or reduced-fat mayonnaise and salad dressings (reduced sodium). Avocado. Safflower, olive, or canola oils. Natural peanut or almond butter. °Other °Unsalted popcorn and pretzels. °The items listed above may not be a complete list of recommended foods or beverages. Contact your dietitian for more options. °WHAT FOODS ARE NOT RECOMMENDED? °Grains °White bread. White pasta. White rice. Refined cornbread. Bagels and croissants. Crackers that contain trans fat. °Vegetables °Creamed or fried vegetables. Vegetables in a cheese sauce. Regular canned vegetables. Regular canned tomato sauce and paste. Regular tomato and vegetable juices. °Fruits °Dried fruits. Canned fruit in light or heavy syrup. Fruit juice. °Meat and Other Protein Products °Fatty cuts of meat. Ribs, chicken wings, bacon, sausage, bologna, salami, chitterlings, fatback, hot dogs, bratwurst, and packaged luncheon meats. Salted nuts and seeds. Canned beans with salt. °Dairy °Whole or 2% milk, cream, half-and-half, and cream cheese. Whole-fat or sweetened yogurt. Full-fat   cheeses or blue cheese. Nondairy creamers and whipped toppings. Processed cheese, cheese spreads, or cheese  curds. °Condiments °Onion and garlic salt, seasoned salt, table salt, and sea salt. Canned and packaged gravies. Worcestershire sauce. Tartar sauce. Barbecue sauce. Teriyaki sauce. Soy sauce, including reduced sodium. Steak sauce. Fish sauce. Oyster sauce. Cocktail sauce. Horseradish. Ketchup and mustard. Meat flavorings and tenderizers. Bouillon cubes. Hot sauce. Tabasco sauce. Marinades. Taco seasonings. Relishes. °Fats and Oils °Butter, stick margarine, lard, shortening, ghee, and bacon fat. Coconut, palm kernel, or palm oils. Regular salad dressings. °Other °Pickles and olives. Salted popcorn and pretzels. °The items listed above may not be a complete list of foods and beverages to avoid. Contact your dietitian for more information. °WHERE CAN I FIND MORE INFORMATION? °National [employer], [employer], [employer]: www.nhlbi.nih.gov/health/health-topics/topics/dash/ °Document Released: 03/01/2011 Document Revised: 07/27/2013 Document Reviewed: 01/14/2013 °[employer]® Patient Information ©2015 [employer]. This information is not intended to replace advice given to you by your health care provider. Make sure you discuss any questions you have with your health care provider. ° °

## 2014-03-10 NOTE — Progress Notes (Signed)
Patient ID: Meghan Finley, female   DOB: 12-Feb-1971, 43 y.o.   MRN: [id number]  SUBJECTIVE: 43 y.o. female for follow up of diabetes. Diabetic Review of Systems - medication compliance: compliant all of the time, diabetic diet compliance: compliant most of the time, further diabetic ROS: no polyuria or polydipsia, no chest pain, dyspnea or TIA's, no hypoglycemia, no medication side effects noted.  Other symptoms and concerns: swelling and pain in her hands and feet for a few weeks.  She describes the foot pain as sharp achy pain.  She complains of constipation. She has tried prune juice and baking soda. She notes that she passes more gas around her menstrual cycle. Notes night sweats and reports that she has not had fibroids evaluated in one year.   Current Outpatient Prescriptions  Medication Sig Dispense Refill  . atorvastatin (LIPITOR) 10 MG tablet Take 1 tablet (10 mg total) by mouth daily. 30 tablet 3  . ferrous sulfate 325 (65 FE) MG tablet Take 1 tablet (325 mg total) by mouth daily with breakfast. 90 tablet 3  . glipiZIDE (GLUCOTROL) 5 MG tablet Take 1 tablet (5 mg total) by mouth 2 (two) times daily before a meal. 60 tablet 3  . glucose monitoring kit (FREESTYLE) monitoring kit 1 each by Does not apply route 4 (four) times daily - after meals and at bedtime. 1 month Diabetic Testing Supplies for QAC-QHS accuchecks. Switch to any brand 1 each 1  . Insulin Glargine (LANTUS SOLOSTAR) 100 UNIT/ML Solostar Pen Inject 10 Units into the skin daily at 10 pm. 15 mL 3  . lisinopril (ZESTRIL) 2.5 MG tablet Take 1 tablet (2.5 mg total) by mouth daily. 30 tablet 3  . metFORMIN (GLUCOPHAGE) 1000 MG tablet Take 1 tablet (1,000 mg total) by mouth 2 (two) times daily with a meal. 60 tablet 3  . venlafaxine XR (EFFEXOR-XR) 150 MG 24 hr capsule Take 150 mg by mouth daily with breakfast.    . acetaminophen (TYLENOL) 500 MG tablet Take 500 mg by mouth every 6 (six) hours as needed for pain.    Marland Kitchen aspirin 81 MG  tablet Take 81 mg by mouth as needed for pain.    . Ibuprofen-Diphenhydramine HCl (ADVIL PM) 200-25 MG CAPS Take 1 tablet by mouth at bedtime as needed (sleep/pain).    . misoprostol (CYTOTEC) 200 MCG tablet Take 3 pills by mouth the night before endometrial biopsy. (Patient not taking: Reported on 03/10/2014) 3 tablet 0  . sertraline (ZOLOFT) 50 MG tablet Take 1 tablet (50 mg total) by mouth daily. (Patient not taking: Reported on 03/10/2014) 30 tablet 3  . sodium-potassium bicarbonate (ALKA-SELTZER GOLD) TBEF dissolvable tablet Take 1 tablet by mouth 2 (two) times daily as needed (allergy symptoms).     No current facility-administered medications for this visit.    OBJECTIVE: Appearance: alert, well appearing, and in no distress. BP 129/83 mmHg  Pulse 99  Temp(Src) 98.3 F (36.8 C) (Oral)  Resp 16  Ht $R'5\' 4"'ER$  (1.626 m)  Wt 269 lb (122.018 kg)  BMI 46.15 kg/m2  SpO2 98%  Exam: heart sounds normal rate, regular rhythm, normal S1, S2, no murmurs, rubs, clicks or gallops, normal bilateral carotid upstroke without bruits, no JVD, chest clear, no carotid bruits, feet: warm, good capillary refill, normal DP and PT pulses, normal monofilament exam and normal sensory exam. Rash on back of neck  ASSESSMENT: Diabetes Mellitus: improved  PLAN: See orders for this visit as documented in the electronic medical record. Issues  reviewed with her: low cholesterol diet, weight control and daily exercise discussed, foot care discussed and Podiatry visits discussed, annual eye examinations at Ophthalmology discussed and long term diabetic complications discussed.     Assessment and plan:   Meghan Finley was seen today for follow-up.  Diagnoses and associated orders for this visit:  Type 2 diabetes mellitus without complication - Glucose (CBG) - HgB A1c - Refill atorvastatin (LIPITOR) 10 MG tablet; Take 1 tablet (10 mg total) by mouth daily. - Refill glipiZIDE (GLUCOTROL) 5 MG tablet; Take 1 tablet (5  mg total) by mouth 2 (two) times daily before a meal. - Refill Insulin Glargine (LANTUS SOLOSTAR) 100 UNIT/ML Solostar Pen; Inject 10 Units into the skin daily at 10 pm. - Refill lisinopril (ZESTRIL) 2.5 MG tablet; Take 1 tablet (2.5 mg total) by mouth daily. - Refill metFORMIN (GLUCOPHAGE) 1000 MG tablet; Take 1 tablet (1,000 mg total) by mouth 2 (two) times daily with a meal. Patients diabetes is well control as evidence by consistently low a1c.  Patient will continue with current therapy and continue to make necessary lifestyle changes.  Reviewed foot care, diet, exercise, annual health maintenance with patient.   Depression - Ambulatory referral to Psychiatry  Refused influenza vaccine Explained that annual influenza is recommended per CDC guidelines and is highly suggested to anyone who has has CHF, COPD, DM or immunocompromised. Benefits of influenza described in detail.  Foot pain - predniSONE (DELTASONE) 20 MG tablet; Take 1 tablet (20 mg total) by mouth daily with breakfast. - naproxen (NAPROSYN) 500 MG tablet; Take 1 tablet (500 mg total) by mouth 2 (two) times daily with a meal.  Return in about 3 months (around 06/09/2014) for Diabetes Mellitus.        Meghan Manning, NP-C [facility] [phone] 03/10/2014, 5:17 PM

## 2014-03-29 ENCOUNTER — Ambulatory Visit: Payer: Self-pay

## 2014-03-31 ENCOUNTER — Other Ambulatory Visit: Payer: Self-pay | Admitting: *Deleted

## 2014-03-31 DIAGNOSIS — D259 Leiomyoma of uterus, unspecified: Secondary | ICD-10-CM

## 2014-03-31 NOTE — Progress Notes (Signed)
Pt requested a referral to OBGYN.  I placed the referral and she should get a call soon.

## 2014-04-21 ENCOUNTER — Telehealth: Payer: Self-pay | Admitting: *Deleted

## 2014-04-21 DIAGNOSIS — D259 Leiomyoma of uterus, unspecified: Secondary | ICD-10-CM

## 2014-04-21 NOTE — Telephone Encounter (Signed)
Called patient in regards to her referral. Patient states that she is having heavy periods and pelvic pain. Per Dr. Harolyn Rutherford will schedule pelvic ultrasound. Patient also requesting to see Dr. Hulan Fray. [place] scheduled for 04/27/14 at 315.

## 2014-04-26 ENCOUNTER — Encounter: Payer: BC Managed Care – PPO | Attending: Internal Medicine

## 2014-04-26 DIAGNOSIS — Z713 Dietary counseling and surveillance: Secondary | ICD-10-CM | POA: Diagnosis not present

## 2014-04-26 DIAGNOSIS — E139 Other specified diabetes mellitus without complications: Secondary | ICD-10-CM | POA: Insufficient documentation

## 2014-04-26 DIAGNOSIS — Z794 Long term (current) use of insulin: Secondary | ICD-10-CM | POA: Diagnosis not present

## 2014-04-27 ENCOUNTER — Ambulatory Visit (HOSPITAL_COMMUNITY)
Admission: RE | Admit: 2014-04-27 | Discharge: 2014-04-27 | Disposition: A | Discharge status: Home / Self Care | Payer: BC Managed Care – PPO | Source: Ambulatory Visit | Attending: Obstetrics & Gynecology | Admitting: Obstetrics & Gynecology

## 2014-04-27 DIAGNOSIS — N939 Abnormal uterine and vaginal bleeding, unspecified: Secondary | ICD-10-CM | POA: Diagnosis present

## 2014-04-27 DIAGNOSIS — D252 Subserosal leiomyoma of uterus: Secondary | ICD-10-CM | POA: Insufficient documentation

## 2014-04-27 DIAGNOSIS — D259 Leiomyoma of uterus, unspecified: Secondary | ICD-10-CM

## 2014-04-27 NOTE — Progress Notes (Signed)
Appt start time: 1730 end time:  1830.  Patient was seen on 04/26/2014 for a review of the series of three diabetes self-management courses at [facility]. The following learning objectives were met by the patient during this class:  . Reviewed blood glucose monitoring and interpretation including the recommended target ranges and Hgb A1c.  . Reviewed on carb counting, importance of regularly scheduled meals/snacks, and meal planning.  . Reviewed the effects of physical activity on glucose levels and long-term glucose control.  Recommended goal of 150 minutes of physical activity/week. . Reviewed patient medications and discussed role of medication on blood glucose and possible side effects. . Discussed strategies to manage stress, psychosocial issues, and other obstacles to diabetes management. . Encouraged moderate weight reduction to improve glucose levels.   . Reviewed short-term complications: hyper- and hypo-glycemia.  Discussed causes, symptoms, and treatment options. . Reviewed prevention, detection, and treatment of long-term complications.  Discussed the role of prolonged elevated glucose levels on body systems.  Goals:  Follow Diabetes Meal Plan as instructed  Eat 3 meals and 2 snacks, every 3-5 hrs  Limit carbohydrate intake to 45 grams carbohydrate/meal Limit carbohydrate intake to 15 grams carbohydrate/snack Add lean protein foods to meals/snacks  Monitor glucose levels as instructed by your doctor  Aim for goal of 15-30 mins of physical activity daily as tolerated  Bring food record and glucose log to your next nutrition visit

## 2014-05-21 LAB — HM DIABETES EYE EXAM

## 2014-05-27 ENCOUNTER — Encounter: Payer: Self-pay | Admitting: Internal Medicine

## 2014-05-27 DIAGNOSIS — E119 Type 2 diabetes mellitus without complications: Secondary | ICD-10-CM | POA: Insufficient documentation

## 2014-05-28 ENCOUNTER — Encounter: Payer: Self-pay | Admitting: Obstetrics & Gynecology

## 2014-06-14 ENCOUNTER — Other Ambulatory Visit: Payer: Self-pay | Admitting: *Deleted

## 2014-06-14 ENCOUNTER — Telehealth: Payer: Self-pay | Admitting: Internal Medicine

## 2014-06-14 MED ORDER — INSULIN GLARGINE 300 UNIT/ML ~~LOC~~ SOPN: 10.0000 [IU] | PEN_INJECTOR | Freq: Every day | SUBCUTANEOUS | Status: DC

## 2014-06-14 NOTE — Progress Notes (Signed)
Pt came in today c/o not being able to afford her insulin even with insurance. I went to Dr. Doreene Burke and he approved to switch her to [facility]

## 2014-06-14 NOTE — Telephone Encounter (Signed)
Pt called requesting an antibiotic for sinus infection. Please f/u with pt if PCP appt is needed

## 2014-06-23 ENCOUNTER — Telehealth: Payer: Self-pay | Admitting: *Deleted

## 2014-06-23 NOTE — Telephone Encounter (Signed)
Pt contacted the clinic, stating she could not afford the copay for the appointment and wanted to make sure there was nothing life threatening wrong with her.    Contacted Patient, ultrasound results given.  Pt verbalizes understanding and has no further questions.

## 2014-06-24 ENCOUNTER — Encounter: Payer: Self-pay | Admitting: Family Medicine

## 2014-07-27 ENCOUNTER — Encounter: Payer: Self-pay | Admitting: Internal Medicine

## 2014-08-26 ENCOUNTER — Ambulatory Visit: Payer: BC Managed Care – PPO | Attending: Internal Medicine | Admitting: Internal Medicine

## 2014-08-26 ENCOUNTER — Encounter: Payer: Self-pay | Admitting: Internal Medicine

## 2014-08-26 VITALS — BP 139/80 | HR 95 | Temp 98.2°F | Resp 18 | Ht 64.0 in | Wt 277.0 lb

## 2014-08-26 DIAGNOSIS — I10 Essential (primary) hypertension: Secondary | ICD-10-CM | POA: Diagnosis not present

## 2014-08-26 DIAGNOSIS — F329 Major depressive disorder, single episode, unspecified: Secondary | ICD-10-CM | POA: Insufficient documentation

## 2014-08-26 DIAGNOSIS — Z794 Long term (current) use of insulin: Secondary | ICD-10-CM | POA: Insufficient documentation

## 2014-08-26 DIAGNOSIS — D649 Anemia, unspecified: Secondary | ICD-10-CM | POA: Insufficient documentation

## 2014-08-26 DIAGNOSIS — Z6841 Body Mass Index (BMI) 40.0 and over, adult: Secondary | ICD-10-CM | POA: Diagnosis not present

## 2014-08-26 DIAGNOSIS — F419 Anxiety disorder, unspecified: Secondary | ICD-10-CM | POA: Insufficient documentation

## 2014-08-26 DIAGNOSIS — E11319 Type 2 diabetes mellitus with unspecified diabetic retinopathy without macular edema: Secondary | ICD-10-CM | POA: Diagnosis present

## 2014-08-26 DIAGNOSIS — E785 Hyperlipidemia, unspecified: Secondary | ICD-10-CM | POA: Insufficient documentation

## 2014-08-26 DIAGNOSIS — Z7982 Long term (current) use of aspirin: Secondary | ICD-10-CM | POA: Insufficient documentation

## 2014-08-26 LAB — GLUCOSE, POCT (MANUAL RESULT ENTRY): POC GLUCOSE: 168 mg/dL — AB (ref 70–99)

## 2014-08-26 LAB — POCT GLYCOSYLATED HEMOGLOBIN (HGB A1C): Hemoglobin A1C: 9

## 2014-08-26 MED ORDER — METFORMIN HCL 1000 MG PO TABS
1000.0000 mg | ORAL_TABLET | Freq: Two times a day (BID) | ORAL | Status: DC
Start: 2014-08-26 — End: 2014-12-03

## 2014-08-26 MED ORDER — ATORVASTATIN CALCIUM 10 MG PO TABS: 10.0000 mg | ORAL_TABLET | Freq: Every day | ORAL | Status: DC

## 2014-08-26 MED ORDER — LISINOPRIL 2.5 MG PO TABS
2.5000 mg | ORAL_TABLET | Freq: Every day | ORAL | Status: DC
Start: 2014-08-26 — End: 2015-08-17

## 2014-08-26 MED ORDER — GLIPIZIDE 5 MG PO TABS: 5.0000 mg | ORAL_TABLET | Freq: Two times a day (BID) | ORAL | Status: DC

## 2014-08-26 NOTE — Progress Notes (Signed)
Patient ID: Meghan Finley, female   DOB: Aug 09, 1970, 44 y.o.   MRN: [id number]  CC: DM f/u   HPI: Meghan Finley is a 44 y.o. female here today for a follow up visit.  Patient has past medical history of T2DM, anemia, depression, HTN, HLD. Patient reports that since her last visit she was switched to [facility] and has noticed that it has not been helping her blood sugars as much as the insulin. She does not check her blood sugar. She reports that she does forget to take her evening dose of glipizide, metformin, and insulin often.  Hand stiffiness and swelling around her cycle. She has been unable to go back to Psychiatry because of expensive co-pays. She has been making her Klonopin last until she is able to get the money to go back for a follow up visit.   Patient has No headache, No chest pain, No abdominal pain - No Nausea, No new weakness tingling or numbness, No Cough - SOB.  No Known Allergies Past Medical History  Diagnosis Date  . Diabetes mellitus   . Anemia   . Anxiety   . Depression   . Morbid obesity   . Hyperlipidemia   . Hypertension    Current Outpatient Prescriptions on File Prior to Visit  Medication Sig Dispense Refill  . atorvastatin (LIPITOR) 10 MG tablet Take 1 tablet (10 mg total) by mouth daily. 30 tablet 4  . ferrous sulfate 325 (65 FE) MG tablet Take 1 tablet (325 mg total) by mouth daily with breakfast. 90 tablet 3  . glipiZIDE (GLUCOTROL) 5 MG tablet Take 1 tablet (5 mg total) by mouth 2 (two) times daily before a meal. 60 tablet 4  . glucose monitoring kit (FREESTYLE) monitoring kit 1 each by Does not apply route 4 (four) times daily - after meals and at bedtime. 1 month Diabetic Testing Supplies for QAC-QHS accuchecks. Switch to any brand 1 each 1  . Insulin Glargine (TOUJEO SOLOSTAR) 300 UNIT/ML SOPN Inject 10 Units into the skin daily at 10 pm. 4.5 mL 3  . lisinopril (ZESTRIL) 2.5 MG tablet Take 1 tablet (2.5 mg total) by mouth daily. 30 tablet 4  . metFORMIN  (GLUCOPHAGE) 1000 MG tablet Take 1 tablet (1,000 mg total) by mouth 2 (two) times daily with a meal. 60 tablet 4  . acetaminophen (TYLENOL) 500 MG tablet Take 500 mg by mouth every 6 (six) hours as needed for pain.    Marland Kitchen aspirin 81 MG tablet Take 81 mg by mouth as needed for pain.    Marland Kitchen sertraline (ZOLOFT) 50 MG tablet Take 1 tablet (50 mg total) by mouth daily. (Patient not taking: Reported on 03/10/2014) 30 tablet 3  . venlafaxine XR (EFFEXOR-XR) 150 MG 24 hr capsule Take 150 mg by mouth daily with breakfast.     No current facility-administered medications on file prior to visit.   Family History  Problem Relation Age of Onset  . Heart disease Mother   . Diabetes Mother   . Heart disease Father   . Asthma Sister   . Diabetes Sister    History   Social History  . Marital Status: Single    Spouse Name: N/A  . Number of Children: N/A  . Years of Education: N/A   Occupational History  . Not on file.   Social History Main Topics  . Smoking status: Never Smoker   . Smokeless tobacco: Never Used  . Alcohol Use: 1.0 oz/week    2  drink(s) per week     Comment: 1-2 glasses wine   . Drug Use: No  . Sexual Activity:    Partners: Male    Birth Control/ Protection: None   Other Topics Concern  . Not on file   Social History Narrative    Review of Systems: See HPI   Objective:   Filed Vitals:   08/26/14 1540  BP: 139/80  Pulse: 95  Temp: 98.2 F (36.8 C)  Resp: 18    Physical Exam  Constitutional: She is oriented to person, place, and time.  Cardiovascular: Normal rate, regular rhythm and normal heart sounds.   Pulmonary/Chest: Effort normal and breath sounds normal.  Musculoskeletal: She exhibits no edema.  Neurological: She is alert and oriented to person, place, and time.  Skin: Skin is warm and dry.    Lab Results  Component Value Date   WBC 6.6 11/03/2013   HGB 9.5* 11/03/2013   HCT 31.1* 11/03/2013   MCV 68.8* 11/03/2013   PLT 475* 11/03/2013   Lab  Results  Component Value Date   CREATININE 0.63 11/03/2013   BUN 9 11/03/2013   NA 134* 11/03/2013   K 4.3 11/03/2013   CL 100 11/03/2013   CO2 23 11/03/2013    Lab Results  Component Value Date   HGBA1C 9.0 08/26/2014   Lipid Panel     Component Value Date/Time   CHOL 153 11/03/2013 0905   TRIG 59 11/03/2013 0905   HDL 48 11/03/2013 0905   CHOLHDL 3.2 11/03/2013 0905   VLDL 12 11/03/2013 0905   LDLCALC 93 11/03/2013 0905       Assessment and plan:   Anni was seen today for hypertension and diabetes.  Diagnoses and all orders for this visit:  Controlled type 2 diabetes with retinopathy Orders: -     Glucose (CBG) -     HgB A1c -    refill metFORMIN (GLUCOPHAGE) 1000 MG tablet; Take 1 tablet (1,000 mg total) by mouth 2 (two) times daily with a meal.. -    Refill lisinopril (ZESTRIL) 2.5 MG tablet; Take 1 tablet (2.5 mg total) by mouth daily.---kidney protection  -     Refill glipiZIDE (GLUCOTROL) 5 MG tablet; Take 1 tablet (5 mg total) by mouth 2 (two) times daily before a meal. -     Microalbumin, urine -     COMPLETE METABOLIC PANEL WITH GFR Went over consequences of non-compliance.   HLD -     Refill atorvastatin (LIPITOR) 10 MG tablet; Take 1 tablet (10 mg total) by mouth daily Education provided on proper lifestyle changes in order to lower cholesterol. Patient advised to maintain healthy weight and to keep total fat intake at 25-35% of total calories and carbohydrates 50-60% of total daily calories. Explained how high cholesterol places patient at risk for heart disease. Patient placed on appropriate medication and repeat labs in 6 months   Return in about 2 weeks (around 09/09/2014) for Nurse Visit-cbg review, Lab visit and 3 mo PCP.        Chari Manning, NP-C [facility] [phone] 08/26/2014, 4:10 PM

## 2014-08-26 NOTE — Progress Notes (Signed)
Follow up for blood pressure and diabetes. Current blood glucose 168. Patient reports problem with having bowel movement. Patient states "I have to do all kinds of tricks to pass a bowel movement even though I go more than once a day". Patient has swelling/stiffness of hands around time for her cycle. Patient denies pain at this time.   Patient was referred to see psychiatrist. Patient reports medicine helps her sleep at night and get through a challenging day but copay is too expensive because she has to go numerous times.   Patient takes vitamin D3, a probiotic, allergy pill and fish oil.

## 2014-08-26 NOTE — Patient Instructions (Signed)
Please take all of your medications, will see nurse in 2 weeks

## 2014-08-27 LAB — COMPLETE METABOLIC PANEL WITH GFR
ALT: 32 U/L (ref 0–35)
AST: 23 U/L (ref 0–37)
Albumin: 4 g/dL (ref 3.5–5.2)
Alkaline Phosphatase: 60 U/L (ref 39–117)
BUN: 11 mg/dL (ref 6–23)
CALCIUM: 9.4 mg/dL (ref 8.4–10.5)
CO2: 24 mEq/L (ref 19–32)
Chloride: 101 mEq/L (ref 96–112)
Creat: 0.62 mg/dL (ref 0.50–1.10)
GFR, Est Non African American: >89 mL/min
Glucose, Bld: 162 mg/dL — ABNORMAL HIGH (ref 70–99)
POTASSIUM: 4.7 meq/L (ref 3.5–5.3)
Sodium: 136 mEq/L (ref 135–145)
Total Bilirubin: 0.5 mg/dL (ref 0.2–1.2)
Total Protein: 7 g/dL (ref 6.0–8.3)

## 2014-08-27 LAB — MICROALBUMIN, URINE: MICROALB UR: 0.3 mg/dL (ref <2.0)

## 2014-09-16 ENCOUNTER — Telehealth: Payer: Self-pay

## 2014-09-16 ENCOUNTER — Other Ambulatory Visit: Payer: Self-pay | Admitting: Internal Medicine

## 2014-09-16 NOTE — Telephone Encounter (Signed)
Nurse called patient, reached voicemail. Left message for patient to call Mahonri Seiden with [facility], at [phone].  

## 2014-09-16 NOTE — Telephone Encounter (Signed)
-----   Message from Lance Bosch, NP sent at 09/03/2014 11:14 AM EDT ----- Labs are within normal limits

## 2014-09-17 NOTE — Telephone Encounter (Signed)
Nurse called patient, reached voicemail. Left message for patient to call Oni Dietzman with [facility], at [phone].  

## 2014-09-17 NOTE — Telephone Encounter (Signed)
Patient called returning nurse's phone call to review results. °

## 2014-09-17 NOTE — Telephone Encounter (Signed)
Nurse called patient, patient verified date of birth. Patient aware of normal labs. Patient questioned appointment time on Monday. Nurse informed patient of appointment on Monday, June 27 at 3pm. Patient voices understanding and has no further questions.

## 2014-09-20 ENCOUNTER — Ambulatory Visit: Payer: BC Managed Care – PPO | Attending: Internal Medicine | Admitting: *Deleted

## 2014-09-20 VITALS — BP 98/64 | HR 92 | Temp 98.6°F | Resp 18 | Ht 64.0 in | Wt 275.6 lb

## 2014-09-20 DIAGNOSIS — Z794 Long term (current) use of insulin: Secondary | ICD-10-CM | POA: Insufficient documentation

## 2014-09-20 DIAGNOSIS — Z136 Encounter for screening for cardiovascular disorders: Secondary | ICD-10-CM | POA: Insufficient documentation

## 2014-09-20 DIAGNOSIS — E119 Type 2 diabetes mellitus without complications: Secondary | ICD-10-CM | POA: Insufficient documentation

## 2014-09-20 DIAGNOSIS — Z0130 Encounter for examination of blood pressure without abnormal findings: Secondary | ICD-10-CM

## 2014-09-20 NOTE — Patient Instructions (Signed)
Basic Carbohydrate Counting for Diabetes Mellitus Carbohydrate counting is a method for keeping track of the amount of carbohydrates you eat. Eating carbohydrates naturally increases the level of sugar (glucose) in your blood, so it is important for you to know the amount that is okay for you to have in every meal. Carbohydrate counting helps keep the level of glucose in your blood within normal limits. The amount of carbohydrates allowed is different for every person. A dietitian can help you calculate the amount that is right for you. Once you know the amount of carbohydrates you can have, you can count the carbohydrates in the foods you want to eat. Carbohydrates are found in the following foods:  Grains, such as breads and cereals.  Dried beans and soy products.  Starchy vegetables, such as potatoes, peas, and corn.  Fruit and fruit juices.  Milk and yogurt.  Sweets and snack foods, such as cake, cookies, candy, chips, soft drinks, and fruit drinks. CARBOHYDRATE COUNTING There are two ways to count the carbohydrates in your food. You can use either of the methods or a combination of both. Reading the "Nutrition Facts" on Packaged Food The "Nutrition Facts" is an area that is included on the labels of almost all packaged food and beverages in the [place]. It includes the serving size of that food or beverage and information about the nutrients in each serving of the food, including the grams (g) of carbohydrate per serving.  Decide the number of servings of this food or beverage that you will be able to eat or drink. Multiply that number of servings by the number of grams of carbohydrate that is listed on the label for that serving. The total will be the amount of carbohydrates you will be having when you eat or drink this food or beverage. Learning Standard Serving Sizes of Food When you eat food that is not packaged or does not include "Nutrition Facts" on the label, you need to  measure the servings in order to count the amount of carbohydrates.A serving of most carbohydrate-rich foods contains about 15 g of carbohydrates. The following list includes serving sizes of carbohydrate-rich foods that provide 15 g ofcarbohydrate per serving:   1 slice of bread (1 oz) or 1 six-inch tortilla.    of a hamburger bun or English muffin.  4-6 crackers.   cup unsweetened dry cereal.    cup hot cereal.   cup rice or pasta.    cup mashed potatoes or  of a large baked potato.  1 cup fresh fruit or one small piece of fruit.    cup canned or frozen fruit or fruit juice.  1 cup milk.   cup plain fat-free yogurt or yogurt sweetened with artificial sweeteners.   cup cooked dried beans or starchy vegetable, such as peas, corn, or potatoes.  Decide the number of standard-size servings that you will eat. Multiply that number of servings by 15 (the grams of carbohydrates in that serving). For example, if you eat 2 cups of strawberries, you will have eaten 2 servings and 30 g of carbohydrates (2 servings x 15 g = 30 g). For foods such as soups and casseroles, in which more than one food is mixed in, you will need to count the carbohydrates in each food that is included. EXAMPLE OF CARBOHYDRATE COUNTING Sample Dinner  3 oz chicken breast.   cup of brown rice.   cup of corn.  1 cup milk.   1 cup strawberries with   sugar-free whipped topping.  Carbohydrate Calculation Step 1: Identify the foods that contain carbohydrates:   Rice.   Corn.   Milk.   Strawberries. Step 2:Calculate the number of servings eaten of each:   2 servings of rice.   1 serving of corn.   1 serving of milk.   1 serving of strawberries. Step 3: Multiply each of those number of servings by 15 g:   2 servings of rice x 15 g = 30 g.   1 serving of corn x 15 g = 15 g.   1 serving of milk x 15 g = 15 g.   1 serving of strawberries x 15 g = 15 g. Step 4: Add  together all of the amounts to find the total grams of carbohydrates eaten: 30 g + 15 g + 15 g + 15 g = 75 g. Document Released: 03/12/2005 Document Revised: 07/27/2013 Document Reviewed: 02/06/2013 [employer] Patient Information 2015 [employer]. This information is not intended to replace advice given to you by your health care provider. Make sure you discuss any questions you have with your health care provider. Diabetes Mellitus and Food It is important for you to manage your blood sugar (glucose) level. Your blood glucose level can be greatly affected by what you eat. Eating healthier foods in the appropriate amounts throughout the day at about the same time each day will help you control your blood glucose level. It can also help slow or prevent worsening of your diabetes mellitus. Healthy eating may even help you improve the level of your blood pressure and reach or maintain a healthy weight.  HOW CAN FOOD AFFECT ME? Carbohydrates Carbohydrates affect your blood glucose level more than any other type of food. Your dietitian will help you determine how many carbohydrates to eat at each meal and teach you how to count carbohydrates. Counting carbohydrates is important to keep your blood glucose at a healthy level, especially if you are using insulin or taking certain medicines for diabetes mellitus. Alcohol Alcohol can cause sudden decreases in blood glucose (hypoglycemia), especially if you use insulin or take certain medicines for diabetes mellitus. Hypoglycemia can be a life-threatening condition. Symptoms of hypoglycemia (sleepiness, dizziness, and disorientation) are similar to symptoms of having too much alcohol.  If your health care provider has given you approval to drink alcohol, do so in moderation and use the following guidelines:  Women should not have more than one drink per day, and men should not have more than two drinks per day. One drink is equal to:  12 oz of beer.  5 oz of  wine.  1 oz of hard liquor.  Do not drink on an empty stomach.  Keep yourself hydrated. Have water, diet soda, or unsweetened iced tea.  Regular soda, juice, and other mixers might contain a lot of carbohydrates and should be counted. WHAT FOODS ARE NOT RECOMMENDED? As you make food choices, it is important to remember that all foods are not the same. Some foods have fewer nutrients per serving than other foods, even though they might have the same number of calories or carbohydrates. It is difficult to get your body what it needs when you eat foods with fewer nutrients. Examples of foods that you should avoid that are high in calories and carbohydrates but low in nutrients include:  Trans fats (most processed foods list trans fats on the Nutrition Facts label).  Regular soda.  Juice.  Candy.  Sweets, such as cake, pie,   doughnuts, and cookies.  Fried foods. WHAT FOODS CAN I EAT? Have nutrient-rich foods, which will nourish your body and keep you healthy. The food you should eat also will depend on several factors, including:  The calories you need.  The medicines you take.  Your weight.  Your blood glucose level.  Your blood pressure level.  Your cholesterol level. You also should eat a variety of foods, including:  Protein, such as meat, poultry, fish, tofu, nuts, and seeds (lean animal proteins are best).  Fruits.  Vegetables.  Dairy products, such as milk, cheese, and yogurt (low fat is best).  Breads, grains, pasta, cereal, rice, and beans.  Fats such as olive oil, trans fat-free margarine, canola oil, avocado, and olives. DOES EVERYONE WITH DIABETES MELLITUS HAVE THE SAME MEAL PLAN? Because every person with diabetes mellitus is different, there is not one meal plan that works for everyone. It is very important that you meet with a dietitian who will help you create a meal plan that is just right for you. Document Released: 12/07/2004 Document Revised:  03/17/2013 Document Reviewed: 02/06/2013 [facility] Patient Information 2015 [place], [place]. This information is not intended to replace advice given to you by your health care provider. Make sure you discuss any questions you have with your health care provider. Diabetes and Exercise Diabetes mellitus is a common, chronic disease, in which the pancreas is unable to adequately control blood glucose (sugar) levels. There are 2 types of diabetes. Type 1 diabetes patients are unable to produce insulin, a hormone that causes sugar in the blood to be stored in the body. People with type 1 diabetes may compensate by giving themselves injections of insulin. Type 2 diabetes involves not producing adequate amounts of insulin to control blood glucose levels. People with type 2 diabetes control their blood glucose by monitoring their food intake or by taking medicine. Exercise is an important part of diabetes treatment. During exercise, the muscles use a greater amount of glucose from the blood for energy. This lowers your blood glucose, which is the same effect you would get from taking insulin. It has been shown that endurance athletes are more sensitive to insulin than inactive people. SYMPTOMS  Many people with a mild case of diabetes have no symptoms. However, if left uncontrolled, diabetes can lead to several complications that could be prevented with treatment of the disease. General symptoms of diabetes include:   Frequent urination (polyuria).  Frequent thirst and drinking (polydipsia).  Increased food consumption (polyphagia).  Fatigue.  Poor exercise performance.  Blurred vision.  Inflammation of the vagina (vaginitis) caused by fungal infections.  Skin infections (uncommon).  Numbness in the feet, caused by nerve injury.  Kidney disease. CAUSES  The cause of most cases of diabetes is unknown. In children, diabetes is often due to an autoimmune response to the cells in the pancreas that  make insulin. It is also linked with other diseases, such as cystic fibrosis. Diabetes may have a genetic link. PREVENTION  Athletes should strive to begin exercise with blood glucose in a well-controlled state.  Feet should always be kept clean and dry.  Activities in which low blood sugar levels cannot be treated easily (scuba diving, rock climbing, swimming) should be avoided.  Anticipate alterations in diet or training to avoid low blood sugar (hypoglycemia) and high blood sugar (hyperglycemia).  Athletes should try to increase sugar consumption after strenuous exercise to avoid hypoglycemia.  Short-acting insulin should not be injected into an actively exercising muscle. The  athlete should rest the injection site for about 1 hour after exercise.  Patients with diabetes should get routine checkups of the feet to prevent complications. PROGNOSIS  Exercise provides many benefits to the person with diabetes:   Reduced body fat.  Lower blood pressure.  Often, reduced need for medicines.  Improved exercise tolerance.  Lower insulin levels.  Weight loss.  Improved lipid profile (decreased cholesterol and low-density lipoproteins). RELATED COMPLICATIONS  If performed incorrectly, exercise can result in complications of diabetes:   Poor control of blood sugar, when exercise is performed at the wrong time.  Increase in renal disease, from loss of body fluids (dehydration).  Increased risk of nerve injury (neuropathy) when performing exercises that increase foot injury.  Increased risk of eye problems when performing activities that involve breath holding or lowering or jarring the head.  Increased risk of sudden death from exercise in patients with heart disease.  Worsening of hypertension with heavy lifting (more than 10 lb/4.5 kg). Altered blood glucose and insulin dose as a result of mild illness that produces loss of appetite.  Altered uptake of insulin after injection  when insulin injection site is changed. NOTE: Exercise can lower blood glucose effectively, but the effects are short-lasting (no more than a couple of days). Exercise has been shown to improve your sensitivity to insulin. This may alter how your body responds to a given dose of injected insulin. It is important for every patient with diabetes to know how his or her body may react to exercise, and to adjust insulin dosages accordingly. TREATMENT  Eat about 1 to 3 hours before exercise.  Check blood glucose immediately before and after exercise.  Stop exercise if blood glucose is more than 250 mg/dL.  Stop exercise if blood glucose is less than 100 mg/dL.  Do not exercise within 1 hour of an insulin injection.  Be prepared to treat low blood glucose while exercising. Keep some sugar product with you, such as a candy bar.  For prolonged exercise, use a sports drink to maintain your glucose level.  Replace used-up glucose in the body after exercise.  Consume fluids during and after exercise to avoid dehydration. SEEK MEDICAL CARE IF:  You have vision changes after a run.  You notice a loss of sensation in your feet after exercise.  You have increased numbness, tingling, or pins and needles sensations after exercise.  You have chest pain during or after exercise.  You have a fast, irregular heartbeat (palpitations) during or after exercise.  Your exercise tolerance gets worse.  You have fainting or dizzy spells for brief periods during or after exercise. Document Released: 03/12/2005 Document Revised: 07/27/2013 Document Reviewed: 06/24/2008 [facility] Patient Information 2015 [place], [place]. This information is not intended to replace advice given to you by your health care provider. Make sure you discuss any questions you have with your health care provider. Type 2 Diabetes Mellitus Type 2 diabetes mellitus, often simply referred to as type 2 diabetes, is a long-lasting (chronic)  disease. In type 2 diabetes, the pancreas does not make enough insulin (a hormone), the cells are less responsive to the insulin that is made (insulin resistance), or both. Normally, insulin moves sugars from food into the tissue cells. The tissue cells use the sugars for energy. The lack of insulin or the lack of normal response to insulin causes excess sugars to build up in the blood instead of going into the tissue cells. As a result, high blood sugar (hyperglycemia) develops. The  effect of high sugar (glucose) levels can cause many complications. Type 2 diabetes was also previously called adult-onset diabetes, but it can occur at any age.  RISK FACTORS  A person is predisposed to developing type 2 diabetes if someone in the family has the disease and also has one or more of the following primary risk factors:  Overweight.  An inactive lifestyle.  A history of consistently eating high-calorie foods. Maintaining a normal weight and regular physical activity can reduce the chance of developing type 2 diabetes. SYMPTOMS  A person with type 2 diabetes may not show symptoms initially. The symptoms of type 2 diabetes appear slowly. The symptoms include:  Increased thirst (polydipsia).  Increased urination (polyuria).  Increased urination during the night (nocturia).  Weight loss. This weight loss may be rapid.  Frequent, recurring infections.  Tiredness (fatigue).  Weakness.  Vision changes, such as blurred vision.  Fruity smell to your breath.  Abdominal pain.  Nausea or vomiting.  Cuts or bruises which are slow to heal.  Tingling or numbness in the hands or feet. DIAGNOSIS Type 2 diabetes is frequently not diagnosed until complications of diabetes are present. Type 2 diabetes is diagnosed when symptoms or complications are present and when blood glucose levels are increased. Your blood glucose level may be checked by one or more of the following blood tests:  A fasting  blood glucose test. You will not be allowed to eat for at least 8 hours before a blood sample is taken.  A random blood glucose test. Your blood glucose is checked at any time of the day regardless of when you ate.  A hemoglobin A1c blood glucose test. A hemoglobin A1c test provides information about blood glucose control over the previous 3 months.  An oral glucose tolerance test (OGTT). Your blood glucose is measured after you have not eaten (fasted) for 2 hours and then after you drink a glucose-containing beverage. TREATMENT   You may need to take insulin or diabetes medicine daily to keep blood glucose levels in the desired range.  If you use insulin, you may need to adjust the dosage depending on the carbohydrates that you eat with each meal or snack. The treatment goal is to maintain the before meal blood sugar (preprandial glucose) level at 70-130 mg/dL. HOME CARE INSTRUCTIONS   Have your hemoglobin A1c level checked twice a year.  Perform daily blood glucose monitoring as directed by your health care provider.  Monitor urine ketones when you are ill and as directed by your health care provider.  Take your diabetes medicine or insulin as directed by your health care provider to maintain your blood glucose levels in the desired range.  Never run out of diabetes medicine or insulin. It is needed every day.  If you are using insulin, you may need to adjust the amount of insulin given based on your intake of carbohydrates. Carbohydrates can raise blood glucose levels but need to be included in your diet. Carbohydrates provide vitamins, minerals, and fiber which are an essential part of a healthy diet. Carbohydrates are found in fruits, vegetables, whole grains, dairy products, legumes, and foods containing added sugars.  Eat healthy foods. You should make an appointment to see a registered dietitian to help you create an eating plan that is right for you.  Lose weight if you are  overweight.  Carry a medical alert card or wear your medical alert jewelry.  Carry a 15-gram carbohydrate snack with you at all times to  treat low blood glucose (hypoglycemia). Some examples of 15-gram carbohydrate snacks include:  Glucose tablets, 3 or 4.  Glucose gel, 15-gram tube.  Raisins, 2 tablespoons (24 grams).  Jelly beans, 6.  Animal crackers, 8.  Regular pop, 4 ounces (120 mL).  Gummy treats, 9.  Recognize hypoglycemia. Hypoglycemia occurs with blood glucose levels of 70 mg/dL and below. The risk for hypoglycemia increases when fasting or skipping meals, during or after intense exercise, and during sleep. Hypoglycemia symptoms can include:  Tremors or shakes.  Decreased ability to concentrate.  Sweating.  Increased heart rate.  Headache.  Dry mouth.  Hunger.  Irritability.  Anxiety.  Restless sleep.  Altered speech or coordination.  Confusion.  Treat hypoglycemia promptly. If you are alert and able to safely swallow, follow the 15:15 rule:  Take 15-20 grams of rapid-acting glucose or carbohydrate. Rapid-acting options include glucose gel, glucose tablets, or 4 ounces (120 mL) of fruit juice, regular soda, or low-fat milk.  Check your blood glucose level 15 minutes after taking the glucose.  Take 15-20 grams more of glucose if the repeat blood glucose level is still 70 mg/dL or below.  Eat a meal or snack within 1 hour once blood glucose levels return to normal.  Be alert to feeling very thirsty and urinating more frequently than usual, which are early signs of hyperglycemia. An early awareness of hyperglycemia allows for prompt treatment. Treat hyperglycemia as directed by your health care provider.  Engage in at least 150 minutes of moderate-intensity physical activity a week, spread over at least 3 days of the week or as directed by your health care provider. In addition, you should engage in resistance exercise at least 2 times a week or as  directed by your health care provider. Try to spend no more than 90 minutes at one time inactive.  Adjust your medicine and food intake as needed if you start a new exercise or sport.  Follow your sick-day plan anytime you are unable to eat or drink as usual.  Do not use any tobacco products including cigarettes, chewing tobacco, or electronic cigarettes. If you need help quitting, ask your health care provider.  Limit alcohol intake to no more than 1 drink per day for nonpregnant women and 2 drinks per day for men. You should drink alcohol only when you are also eating food. Talk with your health care provider whether alcohol is safe for you. Tell your health care provider if you drink alcohol several times a week.  Keep all follow-up visits as directed by your health care provider. This is important.  Schedule an eye exam soon after the diagnosis of type 2 diabetes and then annually.  Perform daily skin and foot care. Examine your skin and feet daily for cuts, bruises, redness, nail problems, bleeding, blisters, or sores. A foot exam by a health care provider should be done annually.  Brush your teeth and gums at least twice a day and floss at least once a day. Follow up with your dentist regularly.  Share your diabetes management plan with your workplace or school.  Stay up-to-date with immunizations. It is recommended that people with diabetes who are over 27 years old get the pneumonia vaccine. In some cases, two separate shots may be given. Ask your health care provider if your pneumonia vaccination is up-to-date.  Learn to manage stress.  Obtain ongoing diabetes education and support as needed.  Participate in or seek rehabilitation as needed to maintain or improve independence and  quality of life. Request a physical or occupational therapy referral if you are having foot or hand numbness, or difficulties with grooming, dressing, eating, or physical activity. SEEK MEDICAL CARE IF:    You are unable to eat food or drink fluids for more than 6 hours.  You have nausea and vomiting for more than 6 hours.  Your blood glucose level is over 240 mg/dL.  There is a change in mental status.  You develop an additional serious illness.  You have diarrhea for more than 6 hours.  You have been sick or have had a fever for a couple of days and are not getting better.  You have pain during any physical activity.  SEEK IMMEDIATE MEDICAL CARE IF:  You have difficulty breathing.  You have moderate to large ketone levels. MAKE SURE YOU:  Understand these instructions.  Will watch your condition.  Will get help right away if you are not doing well or get worse. Document Released: 03/12/2005 Document Revised: 07/27/2013 Document Reviewed: 10/09/2011 [facility] Patient Information 2015 [place], [place]. This information is not intended to replace advice given to you by your health care provider. Make sure you discuss any questions you have with your health care provider.

## 2014-09-20 NOTE — Progress Notes (Signed)
Patient presents for BP check, CBG and record review for T2DM Med list reviewed; patient reports taking all meds as directed Taking lisinopril 2.5 mg daily, metformin 1000 mg bid, glipizide 5 mg bid and toujeo 10 units nightly Patient's AM fasting blood sugars ranging 146-178 ( 6 readings) Patient's before lunch blood sugars ranging 108-204 (8 readings) Denies increased thirst and urination, blurred vision Discussed low carb choices; patient does not drink soda, eats lots of vegetable like carrots and cabbage Discussed walking for exercise. Patient states this is a goal for her but has difficulty finding motivation to start. Discussed making a list of potential benefits and finding an accountability partner to walk with. Patient states this is something she will do  CBG 160 AM fasting per patient. States just ate meal of hot dog on bun with watermelon so CBG not repeated  Lab Results  Component Value Date   HGBA1C 9.0 08/26/2014   Filed Vitals:   09/20/14 1500  BP: 98/64  Pulse: 92  Temp: 98.6 F (37 C)  Resp: 18   Patient denies dizziness, weakness, lightheadedness, feeling like passing out   Per PCP: No changes to meds at this time Return in one month for nurse visit for recheck cbg with more complete log recordings  Patient advised to call for med refills at least 7 days before running out so as not to go without.  Patient given literature on Type 2 Diabetes, Diabetes and Food, Diabetes and Exercise, and Basic Carb Counting

## 2014-10-18 ENCOUNTER — Ambulatory Visit: Payer: BC Managed Care – PPO | Attending: Internal Medicine | Admitting: *Deleted

## 2014-10-18 VITALS — BP 116/68 | HR 98 | Temp 98.7°F | Resp 18 | Ht 64.0 in | Wt 274.8 lb

## 2014-10-18 DIAGNOSIS — Z794 Long term (current) use of insulin: Secondary | ICD-10-CM | POA: Insufficient documentation

## 2014-10-18 DIAGNOSIS — E1165 Type 2 diabetes mellitus with hyperglycemia: Secondary | ICD-10-CM | POA: Insufficient documentation

## 2014-10-18 DIAGNOSIS — E11319 Type 2 diabetes mellitus with unspecified diabetic retinopathy without macular edema: Secondary | ICD-10-CM | POA: Diagnosis not present

## 2014-10-18 LAB — GLUCOSE, POCT (MANUAL RESULT ENTRY): POC Glucose: 187 mg/dl — AB (ref 70–99)

## 2014-10-18 MED ORDER — GLUCOSE BLOOD VI STRP: ORAL_STRIP | Status: DC

## 2014-10-18 MED ORDER — GLIPIZIDE 10 MG PO TABS: 10.0000 mg | ORAL_TABLET | Freq: Two times a day (BID) | ORAL | Status: DC

## 2014-10-18 NOTE — Progress Notes (Signed)
Patient presents for BP check, CBG and record review for T2DM Med list reviewed; patient reports taking all meds as directed Taking glipizide 5 mg bid, metformin 1000 mg bid, toujeo 10 units q HS and lisinopril 2.5 mg daily Patient's AM fasting blood sugars ranging 84-198  Patient's before lunch blood sugars 112 and 146 (2 readings) Patient's before dinner blood sugars ranging 91-206 Patient states she has not had any episodes of hypoglycemia. S/sx of hypoglycemia and immediate actions to take discussed in detail Discussed walking 30-60 minutes daily for exercise Discussed plate method and making low carb choices Denies increased thirst and urination, blurred vision   CBG 96 AM fasting per patient record CBG 187 4 hours after meal at [employer]  Lab Results  Component Value Date   HGBA1C 9.0 08/26/2014   Filed Vitals:   10/18/14 1526  BP: 116/68  Pulse: 98  Temp: 98.7 F (37.1 C)  Resp: 18     Per PCP: Increase glipizide to 10 mg bid If AM fasting BS < 80 decrease that evening's dose of toujeo to 8 units F/u with nurse in 2 weeks for CBG log review  Patient advised to call for med refills at least 7 days before running out so as not to go without.  Patient to return in 2 weeks for nurse visit for BP check, CBG and record review  Patient aware that she is to f/u with PCP 3 months from last visit. Due 11/26/2014  Patient given literature on Diabetes and Exercise, and Basic Carb Counting

## 2014-10-18 NOTE — Patient Instructions (Signed)
Basic Carbohydrate Counting for Diabetes Mellitus Carbohydrate counting is a method for keeping track of the amount of carbohydrates you eat. Eating carbohydrates naturally increases the level of sugar (glucose) in your blood, so it is important for you to know the amount that is okay for you to have in every meal. Carbohydrate counting helps keep the level of glucose in your blood within normal limits. The amount of carbohydrates allowed is different for every person. A dietitian can help you calculate the amount that is right for you. Once you know the amount of carbohydrates you can have, you can count the carbohydrates in the foods you want to eat. Carbohydrates are found in the following foods:  Grains, such as breads and cereals.  Dried beans and soy products.  Starchy vegetables, such as potatoes, peas, and corn.  Fruit and fruit juices.  Milk and yogurt.  Sweets and snack foods, such as cake, cookies, candy, chips, soft drinks, and fruit drinks. CARBOHYDRATE COUNTING There are two ways to count the carbohydrates in your food. You can use either of the methods or a combination of both. Reading the "Nutrition Facts" on Packaged Food The "Nutrition Facts" is an area that is included on the labels of almost all packaged food and beverages in the [place]. It includes the serving size of that food or beverage and information about the nutrients in each serving of the food, including the grams (g) of carbohydrate per serving.  Decide the number of servings of this food or beverage that you will be able to eat or drink. Multiply that number of servings by the number of grams of carbohydrate that is listed on the label for that serving. The total will be the amount of carbohydrates you will be having when you eat or drink this food or beverage. Learning Standard Serving Sizes of Food When you eat food that is not packaged or does not include "Nutrition Facts" on the label, you need to  measure the servings in order to count the amount of carbohydrates.A serving of most carbohydrate-rich foods contains about 15 g of carbohydrates. The following list includes serving sizes of carbohydrate-rich foods that provide 15 g ofcarbohydrate per serving:   1 slice of bread (1 oz) or 1 six-inch tortilla.    of a hamburger bun or English muffin.  4-6 crackers.   cup unsweetened dry cereal.    cup hot cereal.   cup rice or pasta.    cup mashed potatoes or  of a large baked potato.  1 cup fresh fruit or one small piece of fruit.    cup canned or frozen fruit or fruit juice.  1 cup milk.   cup plain fat-free yogurt or yogurt sweetened with artificial sweeteners.   cup cooked dried beans or starchy vegetable, such as peas, corn, or potatoes.  Decide the number of standard-size servings that you will eat. Multiply that number of servings by 15 (the grams of carbohydrates in that serving). For example, if you eat 2 cups of strawberries, you will have eaten 2 servings and 30 g of carbohydrates (2 servings x 15 g = 30 g). For foods such as soups and casseroles, in which more than one food is mixed in, you will need to count the carbohydrates in each food that is included. EXAMPLE OF CARBOHYDRATE COUNTING Sample Dinner  3 oz chicken breast.   cup of brown rice.   cup of corn.  1 cup milk.   1 cup strawberries with   sugar-free whipped topping.  Carbohydrate Calculation Step 1: Identify the foods that contain carbohydrates:   Rice.   Corn.   Milk.   Strawberries. Step 2:Calculate the number of servings eaten of each:   2 servings of rice.   1 serving of corn.   1 serving of milk.   1 serving of strawberries. Step 3: Multiply each of those number of servings by 15 g:   2 servings of rice x 15 g = 30 g.   1 serving of corn x 15 g = 15 g.   1 serving of milk x 15 g = 15 g.   1 serving of strawberries x 15 g = 15 g. Step 4: Add  together all of the amounts to find the total grams of carbohydrates eaten: 30 g + 15 g + 15 g + 15 g = 75 g. Document Released: 03/12/2005 Document Revised: 07/27/2013 Document Reviewed: 02/06/2013 ExitCare Patient Information 2015 [employer]. This information is not intended to replace advice given to you by your health care provider. Make sure you discuss any questions you have with your health care provider. Diabetes and Exercise Exercising regularly is important. It is not just about losing weight. It has many health benefits, such as:  Improving your overall fitness, flexibility, and endurance.  Increasing your bone density.  Helping with weight control.  Decreasing your body fat.  Increasing your muscle strength.  Reducing stress and tension.  Improving your overall health. People with diabetes who exercise gain additional benefits because exercise:  Reduces appetite.  Improves the body's use of blood sugar (glucose).  Helps lower or control blood glucose.  Decreases blood pressure.  Helps control blood lipids (such as cholesterol and triglycerides).  Improves the body's use of the hormone insulin by:  Increasing the body's insulin sensitivity.  Reducing the body's insulin needs.  Decreases the risk for heart disease because exercising:  Lowers cholesterol and triglycerides levels.  Increases the levels of good cholesterol (such as high-density lipoproteins [HDL]) in the body.  Lowers blood glucose levels. YOUR ACTIVITY PLAN  Choose an activity that you enjoy and set realistic goals. Your health care provider or diabetes educator can help you make an activity plan that works for you. Exercise regularly as directed by your health care provider. This includes:  Performing resistance training twice a week such as push-ups, sit-ups, lifting weights, or using resistance bands.  Performing 150 minutes of cardio exercises each week such as walking, running, or  playing sports.  Staying active and spending no more than 90 minutes at one time being inactive. Even short bursts of exercise are good for you. Three 10-minute sessions spread throughout the day are just as beneficial as a single 30-minute session. Some exercise ideas include:  Taking the dog for a walk.  Taking the stairs instead of the elevator.  Dancing to your favorite song.  Doing an exercise video.  Doing your favorite exercise with a friend. RECOMMENDATIONS FOR EXERCISING WITH TYPE 1 OR TYPE 2 DIABETES   Check your blood glucose before exercising. If blood glucose levels are greater than 240 mg/dL, check for urine ketones. Do not exercise if ketones are present.  Avoid injecting insulin into areas of the body that are going to be exercised. For example, avoid injecting insulin into:  The arms when playing tennis.  The legs when jogging.  Keep a record of:  Food intake before and after you exercise.  Expected peak times of insulin action.  Blood   glucose levels before and after you exercise.  The type and amount of exercise you have done.  Review your records with your health care provider. Your health care provider will help you to develop guidelines for adjusting food intake and insulin amounts before and after exercising.  If you take insulin or oral hypoglycemic agents, watch for signs and symptoms of hypoglycemia. They include:  Dizziness.  Shaking.  Sweating.  Chills.  Confusion.  Drink plenty of water while you exercise to prevent dehydration or heat stroke. Body water is lost during exercise and must be replaced.  Talk to your health care provider before starting an exercise program to make sure it is safe for you. Remember, almost any type of activity is better than none. Document Released: 06/02/2003 Document Revised: 07/27/2013 Document Reviewed: 08/19/2012 ExitCare Patient Information 2015 [employer]. This information is not intended to  replace advice given to you by your health care provider. Make sure you discuss any questions you have with your health care provider.  

## 2014-10-19 ENCOUNTER — Other Ambulatory Visit: Payer: Self-pay | Admitting: Internal Medicine

## 2014-10-25 NOTE — Telephone Encounter (Signed)
Pt is requesting a refill on -True Test Strips, she uses a meter called True result. Pt uses our pharmacy and they have this in stock but they dont have the Rx for her test strips. Please follow up with pt.

## 2014-10-26 ENCOUNTER — Other Ambulatory Visit: Payer: Self-pay

## 2014-10-26 ENCOUNTER — Telehealth: Payer: Self-pay

## 2014-10-26 DIAGNOSIS — E11319 Type 2 diabetes mellitus with unspecified diabetic retinopathy without macular edema: Secondary | ICD-10-CM

## 2014-10-26 MED ORDER — GLUCOSE BLOOD VI STRP: ORAL_STRIP | Status: DC

## 2014-10-26 NOTE — Telephone Encounter (Signed)
Nurse called patient, reached voicemail. Left message for patient to call Meghan Finley with [facility], at [phone]. Nurse called to find out what pharmacy test strips should be sent to and what kind of test strips are needed.

## 2014-10-27 ENCOUNTER — Other Ambulatory Visit: Payer: Self-pay | Admitting: Internal Medicine

## 2014-10-27 MED ORDER — GLUCOSE BLOOD VI STRP: ORAL_STRIP | Status: DC

## 2014-11-30 ENCOUNTER — Ambulatory Visit: Payer: BC Managed Care – PPO | Admitting: Internal Medicine

## 2014-12-01 ENCOUNTER — Ambulatory Visit: Payer: BC Managed Care – PPO | Admitting: Internal Medicine

## 2014-12-23 ENCOUNTER — Ambulatory Visit: Payer: BC Managed Care – PPO | Attending: Internal Medicine | Admitting: Internal Medicine

## 2014-12-24 NOTE — Progress Notes (Signed)
Patient ID: Meghan Finley, female   DOB: November 30, 1970, 44 y.o.   MRN: [id number] Patient was called several times but refused to get out of pharmacy line to get seen. Patient was called again 30 minutes after scheduled office visit and still refused so appointment was rescheduled.

## 2015-01-11 ENCOUNTER — Telehealth: Payer: Self-pay | Admitting: Internal Medicine

## 2015-01-11 DIAGNOSIS — E119 Type 2 diabetes mellitus without complications: Secondary | ICD-10-CM

## 2015-01-11 NOTE — Telephone Encounter (Signed)
Pt. Called requesting a med refill on glipiZIDE (GLUCOTROL) 10 MG tablet. Patient would like to pick up her med here at the [facility]. Please f/u with pt.

## 2015-01-14 MED ORDER — GLIPIZIDE 10 MG PO TABS: 10.0000 mg | ORAL_TABLET | Freq: Two times a day (BID) | ORAL | Status: DC

## 2015-01-31 ENCOUNTER — Other Ambulatory Visit: Payer: Self-pay | Admitting: Internal Medicine

## 2015-02-15 ENCOUNTER — Ambulatory Visit: Payer: BC Managed Care – PPO | Admitting: Internal Medicine

## 2015-03-07 ENCOUNTER — Other Ambulatory Visit: Payer: Self-pay | Admitting: Internal Medicine

## 2015-03-07 DIAGNOSIS — Z794 Long term (current) use of insulin: Principal | ICD-10-CM

## 2015-03-07 DIAGNOSIS — E1165 Type 2 diabetes mellitus with hyperglycemia: Secondary | ICD-10-CM

## 2015-03-07 MED ORDER — GLUCOSE BLOOD VI STRP: ORAL_STRIP | Status: DC

## 2015-03-07 MED ORDER — ONETOUCH DELICA LANCETS 33G MISC: 1.0000 | Freq: Three times a day (TID) | Status: AC

## 2015-03-07 MED ORDER — ONETOUCH ULTRA SYSTEM W/DEVICE KIT: 1.0000 | PACK | Freq: Once | Status: AC

## 2015-03-09 ENCOUNTER — Other Ambulatory Visit: Payer: Self-pay | Admitting: Pharmacist

## 2015-03-09 MED ORDER — GLUCOSE BLOOD VI STRP: ORAL_STRIP | Status: DC

## 2015-04-01 MED FILL — metFORMIN HCL 1000 MG TABS: 1000 | 30 days supply | Qty: 60 | Fill #0

## 2015-04-01 MED FILL — LISINOPRIL 5 MG TABLET: 5 | 30 days supply | Qty: 15 | Fill #6

## 2015-04-14 ENCOUNTER — Ambulatory Visit: Payer: BC Managed Care – PPO | Admitting: Internal Medicine

## 2015-04-21 ENCOUNTER — Ambulatory Visit: Payer: BC Managed Care – PPO | Attending: Internal Medicine | Admitting: Internal Medicine

## 2015-04-21 ENCOUNTER — Encounter: Payer: Self-pay | Admitting: Internal Medicine

## 2015-04-21 VITALS — BP 112/71 | HR 100 | Temp 98.0°F | Resp 17 | Ht 64.0 in | Wt 284.0 lb

## 2015-04-21 DIAGNOSIS — Z79899 Other long term (current) drug therapy: Secondary | ICD-10-CM | POA: Insufficient documentation

## 2015-04-21 DIAGNOSIS — E785 Hyperlipidemia, unspecified: Secondary | ICD-10-CM | POA: Diagnosis not present

## 2015-04-21 DIAGNOSIS — Z9114 Patient's other noncompliance with medication regimen: Secondary | ICD-10-CM | POA: Insufficient documentation

## 2015-04-21 DIAGNOSIS — Z113 Encounter for screening for infections with a predominantly sexual mode of transmission: Secondary | ICD-10-CM

## 2015-04-21 DIAGNOSIS — Z794 Long term (current) use of insulin: Secondary | ICD-10-CM | POA: Diagnosis not present

## 2015-04-21 DIAGNOSIS — Z7984 Long term (current) use of oral hypoglycemic drugs: Secondary | ICD-10-CM | POA: Diagnosis not present

## 2015-04-21 DIAGNOSIS — Z7982 Long term (current) use of aspirin: Secondary | ICD-10-CM | POA: Insufficient documentation

## 2015-04-21 DIAGNOSIS — E119 Type 2 diabetes mellitus without complications: Secondary | ICD-10-CM | POA: Diagnosis not present

## 2015-04-21 DIAGNOSIS — D649 Anemia, unspecified: Secondary | ICD-10-CM

## 2015-04-21 LAB — GLUCOSE, POCT (MANUAL RESULT ENTRY): POC Glucose: 240 mg/dl — AB (ref 70–99)

## 2015-04-21 LAB — POCT GLYCOSYLATED HEMOGLOBIN (HGB A1C): Hemoglobin A1C: 8.7

## 2015-04-21 MED ORDER — ATORVASTATIN CALCIUM 10 MG PO TABS: 10.0000 mg | ORAL_TABLET | Freq: Every day | ORAL | Status: DC

## 2015-04-21 MED ORDER — GLIPIZIDE 10 MG PO TABS: 10.0000 mg | ORAL_TABLET | Freq: Two times a day (BID) | ORAL | Status: DC

## 2015-04-21 MED ORDER — LISINOPRIL 2.5 MG PO TABS: 2.5000 mg | ORAL_TABLET | Freq: Every day | ORAL | Status: DC

## 2015-04-21 MED ORDER — INSULIN GLARGINE 300 UNIT/ML ~~LOC~~ SOPN
10.0000 [IU] | PEN_INJECTOR | Freq: Every day | SUBCUTANEOUS | Status: DC
Start: 2015-04-21 — End: 2015-05-16

## 2015-04-21 MED ORDER — METFORMIN HCL 1000 MG PO TABS: ORAL_TABLET | ORAL | Status: DC

## 2015-04-21 MED FILL — ATORVASTATIN 10 MG TABLET: 10 | 30 days supply | Qty: 30 | Fill #1

## 2015-04-21 NOTE — Progress Notes (Signed)
Patient here for her  Routine check up on her diabetes and HTN Patient stated she is sexually active and would like to be checked for STD's Also complains of some itching in her throat that she has had for weeks

## 2015-04-21 NOTE — Progress Notes (Signed)
Patient ID: Meghan Finley, female   DOB: [birth year], 45 y.o.   MRN: [id number] SUBJECTIVE: 45 y.o. female for follow up of diabetes. Diabetic Review of Systems - medication compliance: noncompliant much of the time, diabetic diet compliance: noncompliant much of the time, home glucose monitoring: is performed sporadically, further diabetic ROS: no polyuria or polydipsia, no chest pain, dyspnea or TIA's, no numbness, tingling or pain in extremities, no unusual visual symptoms, no hypoglycemia.   Wants to be tested for all STD's. She has one partner but is concerned about some yellow vaginal discharge with odor.  Current Outpatient Prescriptions  Medication Sig Dispense Refill  . acetaminophen (TYLENOL) 500 MG tablet Take 500 mg by mouth every 6 (six) hours as needed for pain.    Marland Kitchen aspirin 81 MG tablet Take 81 mg by mouth as needed for pain.    Marland Kitchen atorvastatin (LIPITOR) 10 MG tablet TAKE 1 TABLET BY MOUTH DAILY 30 tablet 4  . clonazePAM (KLONOPIN) 1 MG tablet Take 1 mg by mouth 2 (two) times daily as needed for anxiety.    . ferrous sulfate 325 (65 FE) MG tablet TAKE 1 TABLET (325 MG TOTAL) BY MOUTH DAILY WITH BREAKFAST. 90 tablet 3  . glipiZIDE (GLUCOTROL) 10 MG tablet Take 1 tablet (10 mg total) by mouth 2 (two) times daily before a meal. 60 tablet 2  . Insulin Glargine (TOUJEO SOLOSTAR) 300 UNIT/ML SOPN Inject 10 Units into the skin daily at 10 pm. 4.5 mL 3  . lisinopril (ZESTRIL) 2.5 MG tablet Take 1 tablet (2.5 mg total) by mouth daily. 30 tablet 4  . metFORMIN (GLUCOPHAGE) 1000 MG tablet TAKE 1 TABLET BY MOUTH 2 TIMES DAILY WITH A MEAL. 60 tablet 4  . Probiotic Product (PROBIOTIC + OMEGA-3 PO) Take by mouth.    . Blood Glucose Monitoring Suppl (ONE TOUCH ULTRA SYSTEM KIT) W/DEVICE KIT 1 kit by Does not apply route once. 1 each 0  . glucose blood (ONE TOUCH ULTRA TEST) test strip Use three times daily, before meals, and at bedtime 100 each 12  . ONETOUCH DELICA LANCETS 69G MISC 1 each by Does not  apply route 4 (four) times daily -  before meals and at bedtime. 100 each 11   No current facility-administered medications for this visit.  Review of Systems; Other than what is stated in HPI, all other systems are negative.   OBJECTIVE: Appearance: alert, well appearing, and in no distress, oriented to person, place, and time and overweight. BP 112/71 mmHg  Pulse 100  Temp(Src) 98 F (36.7 C)  Resp 17  Ht '5\' 4"'$  (1.626 m)  Wt 284 lb (128.822 kg)  BMI 48.72 kg/m2  SpO2 100%  Physical Exam  Constitutional: She is oriented to person, place, and time.  Cardiovascular: Normal rate, regular rhythm and normal heart sounds.   Pulmonary/Chest: Effort normal and breath sounds normal. Right breast exhibits no mass. Left breast exhibits no mass.  Genitourinary: Uterus normal. No breast tenderness or discharge. Cervix exhibits friability. Cervix exhibits no motion tenderness and no discharge. Right adnexum displays no tenderness. Left adnexum displays no tenderness. Vaginal discharge found.  Lymphadenopathy:       Right: No inguinal adenopathy present.       Left: No inguinal adenopathy present.  Neurological: She is alert and oriented to person, place, and time.   ASSESSMENT: Meghan Finley was seen today for follow-up.  Diagnoses and all orders for this visit:  Type 2 diabetes mellitus without complication, with long-term current  use of insulin (HCC) -     Glucose (CBG) -     HgB A1c -     glipiZIDE (GLUCOTROL) 10 MG tablet; Take 1 tablet (10 mg total) by mouth 2 (two) times daily before a meal. -     Insulin Glargine (TOUJEO SOLOSTAR) 300 UNIT/ML SOPN; Inject 10 Units into the skin daily at 10 pm. -     lisinopril (ZESTRIL) 2.5 MG tablet; Take 1 tablet (2.5 mg total) by mouth daily. -     metFORMIN (GLUCOPHAGE) 1000 MG tablet; TAKE 1 TABLET BY MOUTH 2 TIMES DAILY WITH A MEAL. -     Basic metabolic panel Patients diabetes remains uncontrolled as evidence by hemoglobin a1c >8.  Patient has been  non-compliant with medication regimen. Stressed the multiple complications associated with uncontrolled diabetes.  Patient will stay on current medication dose and report back to clinic with cbg log in 2 weeks.  HLD (hyperlipidemia) -     atorvastatin (LIPITOR) 10 MG tablet; Take 1 tablet (10 mg total) by mouth daily. Education provided on proper lifestyle changes in order to lower cholesterol. Patient advised to maintain healthy weight and to keep total fat intake at 25-35% of total calories and carbohydrates 50-60% of total daily calories. Explained how high cholesterol places patient at risk for heart disease. Patient placed on appropriate medication and repeat labs in 6 months   Anemia, unspecified anemia type -     CBC Will recheck today  Screening for STD (sexually transmitted disease) -     Cytology - PAP Meghan Finley  Return in about 3 months (around 07/20/2015) for Diabetes Mellitus.  Lance Bosch, NP 04/21/2015 6:22 PM

## 2015-04-22 ENCOUNTER — Telehealth: Payer: Self-pay

## 2015-04-22 LAB — CBC
HCT: 39.7 % (ref 36.0–46.0)
Hemoglobin: 13.1 g/dL (ref 12.0–15.0)
MCH: 28.4 pg (ref 26.0–34.0)
MCHC: 33 g/dL (ref 30.0–36.0)
MCV: 86.1 fL (ref 78.0–100.0)
MPV: 10.8 fL (ref 8.6–12.4)
PLATELETS: 338 10*3/uL (ref 150–400)
RBC: 4.61 MIL/uL (ref 3.87–5.11)
RDW: 13.9 % (ref 11.5–15.5)
WBC: 6.7 10*3/uL (ref 4.0–10.5)

## 2015-04-22 LAB — BASIC METABOLIC PANEL
BUN: 13 mg/dL (ref 7–25)
CALCIUM: 9.2 mg/dL (ref 8.6–10.2)
CO2: 26 mmol/L (ref 20–31)
Chloride: 99 mmol/L (ref 98–110)
Creat: 0.7 mg/dL (ref 0.50–1.10)
Glucose, Bld: 265 mg/dL — ABNORMAL HIGH (ref 65–99)
Potassium: 5 mmol/L (ref 3.5–5.3)
Sodium: 137 mmol/L (ref 135–146)

## 2015-04-22 NOTE — Telephone Encounter (Signed)
Called patient this am and gave the phone number to the breast center So patient can schedule her mammogram

## 2015-04-25 LAB — CERVICOVAGINAL ANCILLARY ONLY
CHLAMYDIA, DNA PROBE: NEGATIVE
NEISSERIA GONORRHEA: NEGATIVE
Wet Prep (BD Affirm): POSITIVE — AB

## 2015-04-25 LAB — CYTOLOGY - PAP

## 2015-04-26 ENCOUNTER — Telehealth: Payer: Self-pay

## 2015-04-26 MED ORDER — METRONIDAZOLE 500 MG PO TABS: 500.0000 mg | ORAL_TABLET | Freq: Two times a day (BID) | ORAL | Status: DC

## 2015-04-26 MED FILL — metroNIDAZOLE 500 MG TABS: 500 | 7 days supply | Qty: 14 | Fill #0

## 2015-04-26 NOTE — Telephone Encounter (Signed)
Spoke with patient this am and she is aware of her lab results [facility]

## 2015-04-26 NOTE — Telephone Encounter (Signed)
-----   Message from Lance Bosch, NP sent at 04/26/2015  7:26 AM EST ----- All labs normal except elevated blood sugar and Patient positive for Bacterial Vaginosis . Please explain this is not a STD, but a imbalance of the vaginal pH. Please send Flagyl 500 mg BID for 7 days. No refills, no alcohol while on this medication. Will repeat pap in 3 years

## 2015-04-29 ENCOUNTER — Telehealth: Payer: Self-pay | Admitting: Internal Medicine

## 2015-04-29 ENCOUNTER — Other Ambulatory Visit: Payer: Self-pay

## 2015-04-29 DIAGNOSIS — Z1231 Encounter for screening mammogram for malignant neoplasm of breast: Secondary | ICD-10-CM

## 2015-04-29 NOTE — Telephone Encounter (Signed)
Patient would like a phone call from nurse. Patient has question about the medicine flagyl. Please follow up.

## 2015-05-02 ENCOUNTER — Telehealth: Payer: Self-pay

## 2015-05-02 NOTE — Telephone Encounter (Signed)
Returned patient phone call Patient not available Message left on voice mail to return our call 

## 2015-05-11 MED FILL — LISINOPRIL 5 MG TABLET: 5 | 30 days supply | Qty: 15 | Fill #7

## 2015-05-11 MED FILL — glipiZIDE 10 MG TABS: 10 | 30 days supply | Qty: 60 | Fill #2

## 2015-05-12 MED FILL — metFORMIN HCL 1000 MG TABS: 1000 | 30 days supply | Qty: 60 | Fill #1

## 2015-05-16 ENCOUNTER — Other Ambulatory Visit: Payer: Self-pay | Admitting: Internal Medicine

## 2015-05-16 ENCOUNTER — Other Ambulatory Visit: Payer: Self-pay

## 2015-05-16 MED ORDER — INSULIN DETEMIR 100 UNIT/ML FLEXPEN: 10.0000 [IU] | PEN_INJECTOR | Freq: Every day | SUBCUTANEOUS | Status: DC

## 2015-05-17 ENCOUNTER — Telehealth: Payer: Self-pay

## 2015-05-17 NOTE — Telephone Encounter (Signed)
CMA called patient, patient did not answer. A message for the patient to return my call was left. Awaiting patient call.  Note to patient:  Please inform patient that her Toujeo solostar pen was declined by her insurance, so we replaced it with Levemir Flextouch pen instead.

## 2015-05-23 ENCOUNTER — Ambulatory Visit: Payer: BC Managed Care – PPO

## 2015-06-08 MED FILL — clonazePAM 1 MG TABS: 1 | 30 days supply | Qty: 60 | Fill #1

## 2015-06-09 MED FILL — LISINOPRIL 5 MG TABLET: 5 | 30 days supply | Qty: 15 | Fill #8

## 2015-06-09 MED FILL — ATORVASTATIN 10 MG TABLET: 10 | 30 days supply | Qty: 30 | Fill #2

## 2015-06-09 MED FILL — glipiZIDE 10 MG TABS: 10 | 30 days supply | Qty: 60 | Fill #0

## 2015-06-09 MED FILL — metFORMIN HCL 1000 MG TABS: 1000 | 30 days supply | Qty: 60 | Fill #2

## 2015-06-10 ENCOUNTER — Ambulatory Visit: Payer: BC Managed Care – PPO

## 2015-06-10 MED FILL — LEVEMIR FLEXTOUCH 100 UNITS: 100 | 30 days supply | Qty: 3 | Fill #0

## 2015-06-15 ENCOUNTER — Ambulatory Visit: Payer: BC Managed Care – PPO | Attending: Internal Medicine | Admitting: Internal Medicine

## 2015-06-15 ENCOUNTER — Encounter: Payer: Self-pay | Admitting: Internal Medicine

## 2015-06-15 VITALS — BP 150/82 | HR 99 | Temp 98.0°F | Resp 16 | Ht 64.0 in | Wt 285.0 lb

## 2015-06-15 DIAGNOSIS — Z79899 Other long term (current) drug therapy: Secondary | ICD-10-CM | POA: Insufficient documentation

## 2015-06-15 DIAGNOSIS — I10 Essential (primary) hypertension: Secondary | ICD-10-CM | POA: Insufficient documentation

## 2015-06-15 DIAGNOSIS — H1012 Acute atopic conjunctivitis, left eye: Secondary | ICD-10-CM | POA: Diagnosis not present

## 2015-06-15 DIAGNOSIS — Z7982 Long term (current) use of aspirin: Secondary | ICD-10-CM | POA: Diagnosis not present

## 2015-06-15 DIAGNOSIS — Z794 Long term (current) use of insulin: Secondary | ICD-10-CM | POA: Insufficient documentation

## 2015-06-15 DIAGNOSIS — F329 Major depressive disorder, single episode, unspecified: Secondary | ICD-10-CM | POA: Insufficient documentation

## 2015-06-15 DIAGNOSIS — E785 Hyperlipidemia, unspecified: Secondary | ICD-10-CM | POA: Diagnosis not present

## 2015-06-15 DIAGNOSIS — F419 Anxiety disorder, unspecified: Secondary | ICD-10-CM | POA: Insufficient documentation

## 2015-06-15 DIAGNOSIS — Z7984 Long term (current) use of oral hypoglycemic drugs: Secondary | ICD-10-CM | POA: Insufficient documentation

## 2015-06-15 DIAGNOSIS — E119 Type 2 diabetes mellitus without complications: Secondary | ICD-10-CM | POA: Diagnosis not present

## 2015-06-15 MED ORDER — OLOPATADINE HCL 0.2 % OP SOLN: 1.0000 [drp] | Freq: Every day | OPHTHALMIC | Status: AC

## 2015-06-15 MED ORDER — METHYLPREDNISOLONE SODIUM SUCC 125 MG IJ SOLR
60.0000 mg | Freq: Once | INTRAMUSCULAR | Status: AC
  Administered 2015-06-15: 60 mg via INTRAMUSCULAR

## 2015-06-15 MED FILL — PATADAY 0.2% EYE DROPS: 0.2 | 50 days supply | Qty: 3 | Fill #0

## 2015-06-15 NOTE — Progress Notes (Signed)
Patient here for her allergies Complains of both eyes constantly runny and now having some Dry skin under her left eye Has tried zyrtec and flonase with no relief

## 2015-06-15 NOTE — Progress Notes (Signed)
Patient ID: Meghan Finley, female   DOB: Apr 03, 1970, 45 y.o.   MRN: [id number]  CC: allergies  HPI: Meghan Finley is a 45 y.o. female here today for a follow up visit.  Patient has past medical history of diabetes, anxiety, obesity, HTN, and HLD. Patient reports that she has been suffering from runny eyes bilaterally for the past 3 weeks. She states that the drainage is constant. She has used zyrtec and Flonase on daily without relief. Drainage is watery and feels sticky when she wakes up in the morning. Patient states that she now has a large dark circle under her left eye. She denies any rhinitis, cough, sore throat, or sneezing.   No Known Allergies Past Medical History  Diagnosis Date  . Diabetes mellitus   . Anemia   . Anxiety   . Depression   . Morbid obesity ([place])   . Hyperlipidemia   . Hypertension    Current Outpatient Prescriptions on File Prior to Visit  Medication Sig Dispense Refill  . acetaminophen (TYLENOL) 500 MG tablet Take 500 mg by mouth every 6 (six) hours as needed for pain.    Marland Kitchen aspirin 81 MG tablet Take 81 mg by mouth as needed for pain.    Marland Kitchen atorvastatin (LIPITOR) 10 MG tablet Take 1 tablet (10 mg total) by mouth daily. 30 tablet 4  . Blood Glucose Monitoring Suppl (ONE TOUCH ULTRA SYSTEM KIT) W/DEVICE KIT 1 kit by Does not apply route once. 1 each 0  . clonazePAM (KLONOPIN) 1 MG tablet Take 1 mg by mouth 2 (two) times daily as needed for anxiety.    . ferrous sulfate 325 (65 FE) MG tablet TAKE 1 TABLET (325 MG TOTAL) BY MOUTH DAILY WITH BREAKFAST. 90 tablet 3  . glipiZIDE (GLUCOTROL) 10 MG tablet Take 1 tablet (10 mg total) by mouth 2 (two) times daily before a meal. 60 tablet 2  . glucose blood (ONE TOUCH ULTRA TEST) test strip Use three times daily, before meals, and at bedtime 100 each 12  . Insulin Detemir (LEVEMIR FLEXTOUCH) 100 UNIT/ML Pen Inject 10 Units into the skin daily at 10 pm. 15 mL 11  . lisinopril (ZESTRIL) 2.5 MG tablet Take 1 tablet (2.5 mg total)  by mouth daily. 30 tablet 4  . metFORMIN (GLUCOPHAGE) 1000 MG tablet TAKE 1 TABLET BY MOUTH 2 TIMES DAILY WITH A MEAL. 60 tablet 4  . metroNIDAZOLE (FLAGYL) 500 MG tablet Take 1 tablet (500 mg total) by mouth 2 (two) times daily. 14 tablet 0  . ONETOUCH DELICA LANCETS 68T MISC 1 each by Does not apply route 4 (four) times daily -  before meals and at bedtime. 100 each 11  . Probiotic Product (PROBIOTIC + OMEGA-3 PO) Take by mouth.     No current facility-administered medications on file prior to visit.   Family History  Problem Relation Age of Onset  . Heart disease Mother   . Diabetes Mother   . Heart disease Father   . Asthma Sister   . Diabetes Sister    Social History   Social History  . Marital Status: Single    Spouse Name: N/A  . Number of Children: N/A  . Years of Education: N/A   Occupational History  . Not on file.   Social History Main Topics  . Smoking status: Never Smoker   . Smokeless tobacco: Never Used  . Alcohol Use: 1.0 oz/week    2 drink(s) per week     Comment: 1-2  glasses wine   . Drug Use: No  . Sexual Activity:    Partners: Male    Birth Control/ Protection: None   Other Topics Concern  . Not on file   Social History Narrative    Review of Systems: Other than what is stated in HPI, all other systems are negative.   Objective:   Filed Vitals:   06/15/15 1410  BP: 150/82  Pulse: 99  Temp: 98 F (36.7 C)  Resp: 16    Physical Exam  HENT:  Mouth/Throat: Oropharynx is clear and moist.  Left nasal turbinate swelling  Eyes: Conjunctivae are normal. Right eye exhibits no discharge. Left eye exhibits no discharge.  Allergic shiners   Cardiovascular: Normal rate and regular rhythm.   Pulmonary/Chest: Effort normal and breath sounds normal.     Lab Results  Component Value Date   WBC 6.7 04/21/2015   HGB 13.1 04/21/2015   HCT 39.7 04/21/2015   MCV 86.1 04/21/2015   PLT 338 04/21/2015   Lab Results  Component Value Date    CREATININE 0.70 04/21/2015   BUN 13 04/21/2015   NA 137 04/21/2015   K 5.0 04/21/2015   CL 99 04/21/2015   CO2 26 04/21/2015    Lab Results  Component Value Date   HGBA1C 8.70 04/21/2015   Lipid Panel     Component Value Date/Time   CHOL 153 11/03/2013 0905   TRIG 59 11/03/2013 0905   HDL 48 11/03/2013 0905   CHOLHDL 3.2 11/03/2013 0905   VLDL 12 11/03/2013 0905   LDLCALC 93 11/03/2013 0905       Assessment and plan:   [name] was seen today for teary eye.  Diagnoses and all orders for this visit:  Allergic conjunctivitis of left eye -     methylPREDNISolone sodium succinate (SOLU-MEDROL) 125 mg/2 mL injection 60 mg; Inject 0.96 mLs (60 mg total) into the muscle once in office -     Olopatadine HCl (PATADAY) 0.2 % SOLN; Apply 1 drop to eye daily. Left eye She may continue Zyrtec. I have added eye drops to help with symptoms. Return precautions addressed.  Return in about 5 weeks (around 07/20/2015) for DM/HTN.      Lance Bosch, [facility] [phone] 06/15/2015, 2:42 PM

## 2015-06-20 ENCOUNTER — Encounter (HOSPITAL_COMMUNITY): Payer: Self-pay | Admitting: Emergency Medicine

## 2015-06-20 ENCOUNTER — Emergency Department (INDEPENDENT_AMBULATORY_CARE_PROVIDER_SITE_OTHER)
Admission: EM | Admit: 2015-06-20 | Discharge: 2015-06-20 | Disposition: A | Discharge status: Home / Self Care | Payer: Worker's Compensation | Source: Home / Self Care | Attending: Family Medicine | Admitting: Family Medicine

## 2015-06-20 DIAGNOSIS — T07XXXA Unspecified multiple injuries, initial encounter: Secondary | ICD-10-CM

## 2015-06-20 DIAGNOSIS — T148 Other injury of unspecified body region: Secondary | ICD-10-CM

## 2015-06-20 MED ORDER — TRAMADOL HCL 50 MG PO TABS: 50.0000 mg | ORAL_TABLET | Freq: Four times a day (QID) | ORAL | Status: DC | PRN
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
## 2015-06-20 NOTE — Discharge Instructions (Signed)
Ice for knees and heat for back and medicine as prescribed, see your doctor if further problems.

## 2015-06-20 NOTE — ED Notes (Signed)
Patient went from standing to knees then sitting on floor.  Both knees and back are hurting.  Also reports muscle soreness in thighs.  And right wrist pain

## 2015-07-11 MED FILL — ATORVASTATIN 10 MG TABLET: 10 | 30 days supply | Qty: 30 | Fill #3

## 2015-07-11 MED FILL — LISINOPRIL 5 MG TABLET: 5 | 30 days supply | Qty: 15 | Fill #9

## 2015-07-13 MED FILL — traMADol HCL 50 MG TABS: 50 | 3 days supply | Qty: 10 | Fill #0

## 2015-07-26 ENCOUNTER — Observation Stay (HOSPITAL_COMMUNITY)
Admission: EM | Admit: 2015-07-26 | Discharge: 2015-07-28 | Disposition: A | Discharge status: Home / Self Care | Payer: BC Managed Care – PPO | Attending: Internal Medicine | Admitting: Internal Medicine

## 2015-07-26 ENCOUNTER — Encounter (HOSPITAL_COMMUNITY): Payer: Self-pay | Admitting: Emergency Medicine

## 2015-07-26 ENCOUNTER — Encounter (HOSPITAL_COMMUNITY): Payer: Self-pay | Admitting: *Deleted

## 2015-07-26 ENCOUNTER — Ambulatory Visit (HOSPITAL_COMMUNITY)
Admission: EM | Admit: 2015-07-26 | Discharge: 2015-07-26 | Disposition: A | Discharge status: Other Acute Inpatient Hospital | Payer: BC Managed Care – PPO | Source: Home / Self Care | Attending: Family Medicine | Admitting: Family Medicine

## 2015-07-26 ENCOUNTER — Emergency Department (HOSPITAL_COMMUNITY): Payer: BC Managed Care – PPO

## 2015-07-26 DIAGNOSIS — Z7982 Long term (current) use of aspirin: Secondary | ICD-10-CM | POA: Insufficient documentation

## 2015-07-26 DIAGNOSIS — E785 Hyperlipidemia, unspecified: Secondary | ICD-10-CM | POA: Diagnosis present

## 2015-07-26 DIAGNOSIS — Z7984 Long term (current) use of oral hypoglycemic drugs: Secondary | ICD-10-CM | POA: Diagnosis not present

## 2015-07-26 DIAGNOSIS — F411 Generalized anxiety disorder: Secondary | ICD-10-CM

## 2015-07-26 DIAGNOSIS — E669 Obesity, unspecified: Secondary | ICD-10-CM | POA: Diagnosis present

## 2015-07-26 DIAGNOSIS — F419 Anxiety disorder, unspecified: Secondary | ICD-10-CM | POA: Diagnosis not present

## 2015-07-26 DIAGNOSIS — I10 Essential (primary) hypertension: Secondary | ICD-10-CM | POA: Diagnosis present

## 2015-07-26 DIAGNOSIS — R0789 Other chest pain: Secondary | ICD-10-CM | POA: Diagnosis not present

## 2015-07-26 DIAGNOSIS — Z79899 Other long term (current) drug therapy: Secondary | ICD-10-CM | POA: Insufficient documentation

## 2015-07-26 DIAGNOSIS — F329 Major depressive disorder, single episode, unspecified: Secondary | ICD-10-CM | POA: Diagnosis not present

## 2015-07-26 DIAGNOSIS — R079 Chest pain, unspecified: Secondary | ICD-10-CM | POA: Diagnosis not present

## 2015-07-26 DIAGNOSIS — Z794 Long term (current) use of insulin: Secondary | ICD-10-CM | POA: Diagnosis not present

## 2015-07-26 DIAGNOSIS — D649 Anemia, unspecified: Secondary | ICD-10-CM | POA: Diagnosis not present

## 2015-07-26 DIAGNOSIS — I517 Cardiomegaly: Secondary | ICD-10-CM

## 2015-07-26 DIAGNOSIS — E119 Type 2 diabetes mellitus without complications: Secondary | ICD-10-CM | POA: Diagnosis present

## 2015-07-26 DIAGNOSIS — E11319 Type 2 diabetes mellitus with unspecified diabetic retinopathy without macular edema: Secondary | ICD-10-CM

## 2015-07-26 LAB — CBC
HCT: 36.8 % (ref 36.0–46.0)
Hemoglobin: 12.7 g/dL (ref 12.0–15.0)
MCH: 28.3 pg (ref 26.0–34.0)
MCHC: 34.5 g/dL (ref 30.0–36.0)
MCV: 82.1 fL (ref 78.0–100.0)
PLATELETS: 290 10*3/uL (ref 150–400)
RBC: 4.48 MIL/uL (ref 3.87–5.11)
RDW: 12.5 % (ref 11.5–15.5)
WBC: 6.8 10*3/uL (ref 4.0–10.5)

## 2015-07-26 LAB — BASIC METABOLIC PANEL
Anion gap: 9 (ref 5–15)
BUN: 10 mg/dL (ref 6–20)
CALCIUM: 8.9 mg/dL (ref 8.9–10.3)
CO2: 25 mmol/L (ref 22–32)
CREATININE: 0.72 mg/dL (ref 0.44–1.00)
Chloride: 101 mmol/L (ref 101–111)
GFR calc non Af Amer: >60 mL/min (ref >60)
GLUCOSE: 207 mg/dL — AB (ref 65–99)
Potassium: 3.6 mmol/L (ref 3.5–5.1)
Sodium: 135 mmol/L (ref 135–145)

## 2015-07-26 LAB — I-STAT TROPONIN, ED: TROPONIN I, POC: 0 ng/mL (ref 0.00–0.08)

## 2015-07-26 MED ORDER — ASPIRIN 81 MG PO CHEW
324.0000 mg | CHEWABLE_TABLET | Freq: Once | ORAL | Status: AC
  Administered 2015-07-26: 324 mg via ORAL

## 2015-07-26 MED ORDER — NITROGLYCERIN 0.4 MG SL SUBL
SUBLINGUAL_TABLET | SUBLINGUAL | Status: AC
  Filled 2015-07-26: qty 1

## 2015-07-26 MED ORDER — ASPIRIN 81 MG PO CHEW
CHEWABLE_TABLET | ORAL | Status: AC
Start: 2015-07-26 — End: 2015-07-26
  Filled 2015-07-26: qty 4

## 2015-07-26 MED ORDER — LIDOCAINE HCL 2 % IJ SOLN
INTRAMUSCULAR | Status: AC
  Filled 2015-07-26: qty 20

## 2015-07-26 MED ORDER — NITROGLYCERIN 0.4 MG SL SUBL: 0.4000 mg | SUBLINGUAL_TABLET | SUBLINGUAL | Status: DC | PRN

## 2015-07-26 MED ORDER — SODIUM CHLORIDE 0.9% FLUSH
INTRAVENOUS | Status: AC
  Filled 2015-07-26: qty 10

## 2015-07-26 MED ORDER — SODIUM CHLORIDE 0.9 % IV SOLN: Freq: Once | INTRAVENOUS | Status: DC

## 2015-07-26 NOTE — ED Notes (Signed)
Unable  To obtain iv   Access      After sev  Attempts  ntg  Held  Per  Dr    Juventino Slovak

## 2015-07-26 NOTE — ED Notes (Signed)
IV start unsuccessful. Phlebotomy at bedside attempting blood draw.

## 2015-07-26 NOTE — ED Provider Notes (Signed)
CSN: [id number]     Arrival date & time 07/26/15  1952 History   First MD Initiated Contact with Patient 07/26/15 2020     Chief Complaint  Patient presents with  . Chest Pain   (Consider location/radiation/quality/duration/timing/severity/associated sxs/prior Treatment) Patient is a 45 y.o. female presenting with chest pain. The history is provided by the patient.  Chest Pain Pain location:  Substernal area and L lateral chest Pain quality: pressure   Pain radiates to:  L arm Pain radiates to the back: no   Pain severity:  Moderate Onset quality:  Sudden Timing:  Constant Progression:  Unchanged Chronicity:  New Associated symptoms: diaphoresis, nausea and vomiting   Associated symptoms: no abdominal pain, no back pain, no cough, no dizziness, no fever, no orthopnea, no palpitations, no shortness of breath and no weakness   Risk factors: diabetes mellitus and hypertension   Risk factors: no smoking   Risk factors comment:  Pos fhx for cad,dm cva.   Past Medical History  Diagnosis Date  . Diabetes mellitus   . Anemia   . Anxiety   . Depression   . Morbid obesity ([place])   . Hyperlipidemia   . Hypertension    Past Surgical History  Procedure Laterality Date  . Tooth extraction     Family History  Problem Relation Age of Onset  . Heart disease Mother   . Diabetes Mother   . Heart disease Father   . Asthma Sister   . Diabetes Sister    Social History  Substance Use Topics  . Smoking status: Never Smoker   . Smokeless tobacco: Never Used  . Alcohol Use: 1.0 oz/week    2 drink(s) per week     Comment: 1-2 glasses wine    OB History    Gravida Para Term Preterm AB TAB SAB Ectopic Multiple Living   '0 0 0 0 0 0 0 0 0 0 '$     Review of Systems  Constitutional: Positive for diaphoresis. Negative for fever.  Respiratory: Negative for cough, shortness of breath and wheezing.   Cardiovascular: Positive for chest pain. Negative for palpitations, orthopnea and leg  swelling.  Gastrointestinal: Positive for nausea and vomiting. Negative for abdominal pain.  Genitourinary: Negative.   Musculoskeletal: Negative for back pain.  Neurological: Negative for dizziness and weakness.  All other systems reviewed and are negative.   Allergies  Review of patient's allergies indicates no known allergies.  Home Medications   Prior to Admission medications   Medication Sig Start Date End Date Taking? Authorizing Provider  acetaminophen (TYLENOL) 500 MG tablet Take 500 mg by mouth every 6 (six) hours as needed for pain.    Historical Provider, MD  aspirin 81 MG tablet Take 81 mg by mouth as needed for pain.    Historical Provider, MD  atorvastatin (LIPITOR) 10 MG tablet Take 1 tablet (10 mg total) by mouth daily. 04/21/15   Lance Bosch, NP  Blood Glucose Monitoring Suppl (ONE TOUCH ULTRA SYSTEM KIT) W/DEVICE KIT 1 kit by Does not apply route once. 03/07/15   Tresa Garter, MD  clonazePAM (KLONOPIN) 1 MG tablet Take 1 mg by mouth 2 (two) times daily as needed for anxiety.    Historical Provider, MD  ferrous sulfate 325 (65 FE) MG tablet TAKE 1 TABLET (325 MG TOTAL) BY MOUTH DAILY WITH BREAKFAST. 01/31/15   Lance Bosch, NP  glipiZIDE (GLUCOTROL) 10 MG tablet Take 1 tablet (10 mg total) by mouth 2 (two) times  daily before a meal. 04/21/15   Lance Bosch, NP  glucose blood (ONE TOUCH ULTRA TEST) test strip Use three times daily, before meals, and at bedtime 03/09/15   Tresa Garter, MD  Insulin Detemir (LEVEMIR FLEXTOUCH) 100 UNIT/ML Pen Inject 10 Units into the skin daily at 10 pm. 05/16/15   Lance Bosch, NP  lisinopril (ZESTRIL) 2.5 MG tablet Take 1 tablet (2.5 mg total) by mouth daily. 04/21/15   Lance Bosch, NP  metFORMIN (GLUCOPHAGE) 1000 MG tablet TAKE 1 TABLET BY MOUTH 2 TIMES DAILY WITH A MEAL. 04/21/15   Lance Bosch, NP  metroNIDAZOLE (FLAGYL) 500 MG tablet Take 1 tablet (500 mg total) by mouth 2 (two) times daily. 04/26/15   Lance Bosch,  NP  Olopatadine HCl (PATADAY) 0.2 % SOLN Apply 1 drop to eye daily. Left eye 06/15/15   Lance Bosch, NP  [facility] DELICA LANCETS 15A MISC 1 each by Does not apply route 4 (four) times daily -  before meals and at bedtime. 03/07/15   Tresa Garter, MD  Probiotic Product (PROBIOTIC + OMEGA-3 PO) Take by mouth.    Historical Provider, MD  traMADol (ULTRAM) 50 MG tablet Take 1 tablet (50 mg total) by mouth every 6 (six) hours as needed. For pain 06/20/15   Billy Fischer, MD   Meds Ordered and Administered this Visit   Medications  0.9 %  sodium chloride infusion (not administered)  nitroGLYCERIN (NITROSTAT) SL tablet 0.4 mg (not administered)  aspirin chewable tablet 324 mg (not administered)    BP 131/68 mmHg  Pulse 96  Temp(Src) 98.8 F (37.1 C) (Oral)  Resp 16  SpO2 100%  LMP 07/26/2015 No data found.   Physical Exam  Constitutional: She is oriented to person, place, and time. She appears well-developed and well-nourished. No distress.  Neck: Normal range of motion. Neck supple.  Cardiovascular: Normal rate, regular rhythm, normal heart sounds and intact distal pulses.   Pulmonary/Chest: Effort normal and breath sounds normal. No respiratory distress.  Abdominal: Soft. Bowel sounds are normal. There is no tenderness.  Musculoskeletal: She exhibits no edema.  Neurological: She is alert and oriented to person, place, and time.  Nursing note and vitals reviewed.   ED Course  Procedures (including critical care time)  Labs Review Labs Reviewed - No data to display  Imaging Review No results found.   Visual Acuity Review  Right Eye Distance:   Left Eye Distance:   Bilateral Distance:    Right Eye Near:   Left Eye Near:    Bilateral Near:     <ECG>  ED ECG REPORT   Date: 07/26/2015  Rate: 93  Rhythm: normal sinus rhythm  QRS Axis: normal  Intervals: normal  ST/T Wave abnormalities: normal  Conduction Disutrbances:none  Narrative Interpretation:  wnl .  Old EKG Reviewed: none available  I have personally reviewed the EKG tracing and agree with the computerized printout as noted.   MDM   1. Atypical chest pain        Billy Fischer, MD 07/26/15 2044

## 2015-07-26 NOTE — ED Notes (Signed)
Pt  Placed  On  Cardiac  Monitor  Nasal  o2  At  2 l  /  Min   

## 2015-07-26 NOTE — ED Notes (Signed)
This RN called xray to inform them that the patient is ready to be transported to xray

## 2015-07-26 NOTE — ED Notes (Signed)
Patient went to [facility] after having some chest pain after work today.  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  Patient was given 324mg  ASA and oxygen at [facility].  Patient is now chest pain free.  EKG was normal at [facility]

## 2015-07-26 NOTE — ED Provider Notes (Signed)
Pt seen and evaluated.  Asymptomatic now.  No changes on EKG.  Multiple risks.  RRR.  Clear lungs. Agree with admit for r/o.  Tanna Furry, MD 07/26/15 [phone]

## 2015-07-26 NOTE — ED Notes (Signed)
Pt  rereports     Pain  Radiates   Around to  l  Side   And  l  Arm         Pt  Is  A  Diabetic     Who  Takes  meds        Pt      Reports   asome  Slight  Shortness  Of  Breath   Had      Some  Nausea         Earlier

## 2015-07-26 NOTE — ED Provider Notes (Signed)
CSN: [id number]     Arrival date & time 07/26/15  2155 History   First MD Initiated Contact with Patient 07/26/15 2200     Chief Complaint  Patient presents with  . Chest Pain     (Consider location/radiation/quality/duration/timing/severity/associated sxs/prior Treatment) HPI Patient is a 45 year old female with a history of hypertension, diabetes, obesity, hyperlipidemia, who presents with chest pain. She had a sudden onset of left-sided chest pain that she described as heaviness, approximately 3 or 4 hours ago. It lasted approximately an hour. It radiated to her left arm. She had associated shortness of breath, but no diaphoresis. She said it subsided on its own. She went to urgent care, where they performed an EKG, gave her aspirin, and sent her here for further evaluation. She reports she had one previous episode of chest pain the past, in 2005, when she received a stress test. She does not recall the results of this.  She currently says she is free of chest pain. She denies any tearing or ripping sensation radiating to her back. She has no abdominal pain. She has no lightheadedness or headache.    Past Medical History  Diagnosis Date  . Diabetes mellitus   . Anemia   . Anxiety   . Depression   . Morbid obesity ([place])   . Hyperlipidemia   . Hypertension    Past Surgical History  Procedure Laterality Date  . Tooth extraction     Family History  Problem Relation Age of Onset  . Heart disease Mother   . Diabetes Mother   . Heart disease Father   . Asthma Sister   . Diabetes Sister    Social History  Substance Use Topics  . Smoking status: Never Smoker   . Smokeless tobacco: Never Used  . Alcohol Use: 1.0 oz/week    2 drink(s) per week     Comment: 1-2 glasses wine    OB History    Gravida Para Term Preterm AB TAB SAB Ectopic Multiple Living   _0     Review of Systems  Respiratory: Positive for shortness of breath. Negative for cough, wheezing and  stridor.   Cardiovascular: Positive for chest pain. Negative for palpitations and leg swelling.  Gastrointestinal: Negative for abdominal pain.  All other systems reviewed and are negative.     Allergies  Review of patient's allergies indicates no known allergies.  Home Medications   Prior to Admission medications   Medication Sig Start Date End Date Taking? Authorizing Provider  acetaminophen (TYLENOL) 500 MG tablet Take 500 mg by mouth every 6 (six) hours as needed for pain.    Historical Provider, MD  aspirin 81 MG tablet Take 81 mg by mouth as needed for pain.    Historical Provider, MD  atorvastatin (LIPITOR) 10 MG tablet Take 1 tablet (10 mg total) by mouth daily. 04/21/15   Lance Bosch, NP  Blood Glucose Monitoring Suppl (ONE TOUCH ULTRA SYSTEM KIT) W/DEVICE KIT 1 kit by Does not apply route once. 03/07/15   Tresa Garter, MD  clonazePAM (KLONOPIN) 1 MG tablet Take 1 mg by mouth 2 (two) times daily as needed for anxiety.    Historical Provider, MD  ferrous sulfate 325 (65 FE) MG tablet TAKE 1 TABLET (325 MG TOTAL) BY MOUTH DAILY WITH BREAKFAST. 01/31/15   Lance Bosch, NP  glipiZIDE (GLUCOTROL) 10 MG tablet Take 1 tablet (10 mg total) by mouth 2 (two) times  daily before a meal. 04/21/15   Lance Bosch, NP  glucose blood (ONE TOUCH ULTRA TEST) test strip Use three times daily, before meals, and at bedtime 03/09/15   Tresa Garter, MD  Insulin Detemir (LEVEMIR FLEXTOUCH) 100 UNIT/ML Pen Inject 10 Units into the skin daily at 10 pm. 05/16/15   Lance Bosch, NP  lisinopril (ZESTRIL) 2.5 MG tablet Take 1 tablet (2.5 mg total) by mouth daily. 04/21/15   Lance Bosch, NP  metFORMIN (GLUCOPHAGE) 1000 MG tablet TAKE 1 TABLET BY MOUTH 2 TIMES DAILY WITH A MEAL. 04/21/15   Lance Bosch, NP  metroNIDAZOLE (FLAGYL) 500 MG tablet Take 1 tablet (500 mg total) by mouth 2 (two) times daily. 04/26/15   Lance Bosch, NP  Olopatadine HCl (PATADAY) 0.2 % SOLN Apply 1 drop to eye  daily. Left eye 06/15/15   Lance Bosch, NP  [facility] DELICA LANCETS 25E MISC 1 each by Does not apply route 4 (four) times daily -  before meals and at bedtime. 03/07/15   Tresa Garter, MD  Probiotic Product (PROBIOTIC + OMEGA-3 PO) Take by mouth.    Historical Provider, MD  traMADol (ULTRAM) 50 MG tablet Take 1 tablet (50 mg total) by mouth every 6 (six) hours as needed. For pain 06/20/15   Billy Fischer, MD   BP 111/56 mmHg  Pulse 82  Temp(Src) 97.7 F (36.5 C) (Oral)  Resp 20  SpO2 97%  LMP 07/26/2015 Physical Exam  Constitutional: She is oriented to person, place, and time. She appears well-developed and well-nourished. No distress.  Eyes: Conjunctivae are normal. Pupils are equal, round, and reactive to light.  Neck: Normal range of motion. No JVD present.  Cardiovascular: Normal rate and regular rhythm.   Pulmonary/Chest: Breath sounds normal. She has no wheezes.  Abdominal: Soft. She exhibits no distension. There is no tenderness.  Musculoskeletal: Normal range of motion. She exhibits no edema.  Neurological: She is alert and oriented to person, place, and time.  Skin: Skin is warm and dry.  Psychiatric: She has a normal mood and affect.  Nursing note and vitals reviewed.   ED Course  Procedures (including critical care time) Labs Review Labs Reviewed  BASIC METABOLIC PANEL - Abnormal; Notable for the following:    Glucose, Bld 207 (*)    All other components within normal limits  CBC  I-STAT TROPOININ, ED    Imaging Review No results found. I have personally reviewed and evaluated these images and lab results as part of my medical decision-making.   EKG Interpretation   Date/Time:  Tuesday Jul 26 2015 22:02:55 EDT Ventricular Rate:  81 PR Interval:  122 QRS Duration: 98 QT Interval:  377 QTC Calculation: 438 R Axis:   45 Text Interpretation:  Sinus rhythm Low voltage, precordial leads  Borderline T abnormalities, anterior leads Confirmed by Jeneen Rinks   MD, [place]  [id number]) on 07/26/2015 10:06:12 PM Also confirmed by Jeneen Rinks  MD, [place] ([id number])   on 07/26/2015 11:12:15 PM      MDM   Final diagnoses:  Chest pain, unspecified chest pain type   Patient presents with chest pain. She has no tachycardia, no unilateral leg swelling, no hypoxia, and the pain is not pleuritic, doubt PE. She does have multiple cardiac risk factors, and a history of chest pain with abnormal stress test in the past. I reviewed her old records, and she did receive a stress test in 2005, this seemed to be concerning for  ischemia, but she never received a heart catheterization.  Today, she has borderline T wave abnormalities, but no definitive signs of ischemia on her EKG. Chest x-ray negative for acute process. Troponin negative. Heart score 4, will admit for observation. Discussed with hospitalist, will admit to telemetry.  Leata Mouse, MD 07/27/15 0001  Tanna Furry, MD 08/01/15 [phone]

## 2015-07-27 ENCOUNTER — Observation Stay (HOSPITAL_COMMUNITY): Payer: BC Managed Care – PPO

## 2015-07-27 ENCOUNTER — Observation Stay (HOSPITAL_BASED_OUTPATIENT_CLINIC_OR_DEPARTMENT_OTHER): Payer: BC Managed Care – PPO

## 2015-07-27 DIAGNOSIS — I10 Essential (primary) hypertension: Secondary | ICD-10-CM

## 2015-07-27 DIAGNOSIS — R079 Chest pain, unspecified: Secondary | ICD-10-CM

## 2015-07-27 DIAGNOSIS — E785 Hyperlipidemia, unspecified: Secondary | ICD-10-CM

## 2015-07-27 DIAGNOSIS — E11319 Type 2 diabetes mellitus with unspecified diabetic retinopathy without macular edema: Secondary | ICD-10-CM | POA: Diagnosis not present

## 2015-07-27 DIAGNOSIS — F411 Generalized anxiety disorder: Secondary | ICD-10-CM

## 2015-07-27 DIAGNOSIS — Z794 Long term (current) use of insulin: Secondary | ICD-10-CM

## 2015-07-27 DIAGNOSIS — E669 Obesity, unspecified: Secondary | ICD-10-CM | POA: Diagnosis present

## 2015-07-27 LAB — GLUCOSE, CAPILLARY
GLUCOSE-CAPILLARY: 220 mg/dL — AB (ref 65–99)
GLUCOSE-CAPILLARY: 193 mg/dL — AB (ref 65–99)
GLUCOSE-CAPILLARY: 268 mg/dL — AB (ref 65–99)
Glucose-Capillary: 186 mg/dL — ABNORMAL HIGH (ref 65–99)
Glucose-Capillary: 208 mg/dL — ABNORMAL HIGH (ref 65–99)
Glucose-Capillary: 244 mg/dL — ABNORMAL HIGH (ref 65–99)
Glucose-Capillary: 246 mg/dL — ABNORMAL HIGH (ref 65–99)

## 2015-07-27 LAB — TROPONIN I
Troponin I: <0.03 ng/mL (ref <0.031)
Troponin I: <0.03 ng/mL (ref <0.031)

## 2015-07-27 MED ORDER — INSULIN DETEMIR 100 UNIT/ML ~~LOC~~ SOLN
5.0000 [IU] | Freq: Every day | SUBCUTANEOUS | Status: DC
  Administered 2015-07-27: 5 [IU] via SUBCUTANEOUS
  Filled 2015-07-27 (×3): qty 0.05

## 2015-07-27 MED ORDER — TRAMADOL HCL 50 MG PO TABS: 50.0000 mg | ORAL_TABLET | Freq: Four times a day (QID) | ORAL | Status: DC | PRN

## 2015-07-27 MED ORDER — INSULIN ASPART 100 UNIT/ML ~~LOC~~ SOLN
0.0000 [IU] | SUBCUTANEOUS | Status: DC
  Administered 2015-07-27 (×3): 3 [IU] via SUBCUTANEOUS
  Administered 2015-07-27: 5 [IU] via SUBCUTANEOUS
  Administered 2015-07-27: 2 [IU] via SUBCUTANEOUS
  Administered 2015-07-28 (×4): 3 [IU] via SUBCUTANEOUS

## 2015-07-27 MED ORDER — LISINOPRIL 2.5 MG PO TABS
2.5000 mg | ORAL_TABLET | Freq: Every day | ORAL | Status: DC
  Administered 2015-07-27 – 2015-07-28 (×2): 2.5 mg via ORAL
  Filled 2015-07-27 (×2): qty 1

## 2015-07-27 MED ORDER — FERROUS SULFATE 325 (65 FE) MG PO TABS
325.0000 mg | ORAL_TABLET | Freq: Two times a day (BID) | ORAL | Status: DC
  Administered 2015-07-27 – 2015-07-28 (×4): 325 mg via ORAL
  Filled 2015-07-27 (×4): qty 1

## 2015-07-27 MED ORDER — REGADENOSON 0.4 MG/5ML IV SOLN
0.4000 mg | Freq: Once | INTRAVENOUS | Status: AC
  Administered 2015-07-27: 0.4 mg via INTRAVENOUS
  Filled 2015-07-27: qty 5

## 2015-07-27 MED ORDER — ACETAMINOPHEN 500 MG PO TABS: 500.0000 mg | ORAL_TABLET | Freq: Four times a day (QID) | ORAL | Status: DC | PRN

## 2015-07-27 MED ORDER — ACETAMINOPHEN 325 MG PO TABS
650.0000 mg | ORAL_TABLET | ORAL | Status: DC | PRN
Start: 2015-07-27 — End: 2015-07-28

## 2015-07-27 MED ORDER — CLONAZEPAM 0.5 MG PO TABS
1.0000 mg | ORAL_TABLET | Freq: Two times a day (BID) | ORAL | Status: DC | PRN
  Administered 2015-07-27: 1 mg via ORAL
  Filled 2015-07-27: qty 2

## 2015-07-27 MED ORDER — REGADENOSON 0.4 MG/5ML IV SOLN
INTRAVENOUS | Status: AC
  Administered 2015-07-27: 0.4 mg via INTRAVENOUS
  Filled 2015-07-27: qty 5

## 2015-07-27 MED ORDER — ASPIRIN 81 MG PO CHEW: 81.0000 mg | CHEWABLE_TABLET | Freq: Every day | ORAL | Status: DC | PRN

## 2015-07-27 MED ORDER — ONDANSETRON HCL 4 MG/2ML IJ SOLN: 4.0000 mg | Freq: Four times a day (QID) | INTRAMUSCULAR | Status: DC | PRN

## 2015-07-27 MED ORDER — ASPIRIN 81 MG PO CHEW
81.0000 mg | CHEWABLE_TABLET | ORAL | Status: DC | PRN
Start: 2015-07-27 — End: 2015-07-27

## 2015-07-27 MED ORDER — ENOXAPARIN SODIUM 40 MG/0.4ML ~~LOC~~ SOLN
40.0000 mg | SUBCUTANEOUS | Status: DC
  Administered 2015-07-27 – 2015-07-28 (×2): 40 mg via SUBCUTANEOUS
  Filled 2015-07-27 (×2): qty 0.4

## 2015-07-27 MED ORDER — OLOPATADINE HCL 0.1 % OP SOLN
1.0000 [drp] | Freq: Two times a day (BID) | OPHTHALMIC | Status: DC
  Filled 2015-07-27 (×2): qty 5

## 2015-07-27 MED ORDER — ALPRAZOLAM 0.25 MG PO TABS
0.2500 mg | ORAL_TABLET | Freq: Once | ORAL | Status: AC
  Administered 2015-07-27: 0.25 mg via ORAL
  Filled 2015-07-27: qty 1

## 2015-07-27 MED ORDER — ATORVASTATIN CALCIUM 10 MG PO TABS
10.0000 mg | ORAL_TABLET | Freq: Every day | ORAL | Status: DC
  Administered 2015-07-27 – 2015-07-28 (×2): 10 mg via ORAL
  Filled 2015-07-27 (×2): qty 1

## 2015-07-27 NOTE — Progress Notes (Signed)
PROGRESS NOTE    Meghan Finley  [id number] DOB: Nov 24, 1970 DOA: 07/26/2015 PCP: Lance Bosch, NP Outpatient Specialists: None   Brief Narrative:  Meghan Finley is a 45 y.o. female with medical history significant of DM2, HTN, HLD, family history of early onset CAD. Patient presented to the ED with c/o chest pain. Over the last few months she has had intermittent chest heaviness particularly with exertion. On the date of admission, she had sudden onset of pressure in her left chest associated with some radiation to her left arm and shortness of breath with nausea. Symptoms lasted up to an hour and eventually resolved. She went to urgent care with a gave her an aspirin and send her to the emergency department.   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)   HLD (hyperlipidemia)   HTN (hypertension)   Obesity   Generalized anxiety disorder  Chest pain, heart score 4 -  Awaiting stress test results -  ECHO -  troponins negative -  A1c and lipid panel are pending  Essential hypertension and hyperlipidemia, BP stable -  Continue atorvastatin -  Continue lisinopril  Diabetes mellitus type 2, uncontrolled,  -  A1c -  Hold oral medications -  SSI  Generalized anxiety disorder, may be contributing to her symptoms -  Continue clonazepam -  Would recommend SSRI  Fibroids, menorrhagia, patient perseverates on these issues -  Referred to GYN as outpatient   DVT prophylaxis: Lovenox Code Status: Full Family Communication: Patient alone Disposition Plan: Likely home tomorrow   Consultants:   None  Procedures:  -  Pending  Antimicrobials:   None    Subjective:  Currently feels well. She repeatedly asked for fibroids and being on her period could have contributed to her symptoms. Had an episode of some left shoulder pain this morning that resolved in a  few minutes. She did not have associated shortness of breath, nausea.    Objective: Filed Vitals:   07/27/15 0900 07/27/15 0930 07/27/15 0932 07/27/15 1249  BP: 131/85  119/80 124/80  Pulse: 83 87 88 89  Temp:    97.9 F (36.6 C)  TempSrc:    Oral  Resp:    19  Height:      Weight:      SpO2:    98%    Intake/Output Summary (Last 24 hours) at 07/27/15 1651 Last data filed at 07/27/15 0249  Gross per 24 hour  Intake      0 ml  Output    450 ml  Net   -450 ml   Filed Weights   07/27/15 0113  Weight: 126.69 kg (279 lb 4.8 oz)    Examination:  General exam: Appears calm and comfortable  Respiratory system: Clear to auscultation. Respiratory effort normal. Cardiovascular system: S1 & S2 heard, RRR. No JVD, murmurs, rubs, gallops or clicks. No pedal edema. Gastrointestinal system: Abdomen is nondistended, soft and nontender. No organomegaly or masses felt. Normal bowel sounds heard. Central nervous system: Alert and oriented. No focal neurological deficits. Extremities: Symmetric 5 x 5 power. Skin: No rashes, lesions or ulcers Psychiatry:  Perseverated on stressors in life and whether her fibroids and period may have contributed to symptoms.  Limited insight.    Data Reviewed: I have personally reviewed following labs and imaging studies  CBC:  Recent Labs Lab 07/26/15 2310  WBC 6.8  HGB 12.7  HCT 36.8  MCV 82.1  PLT [id number]   Basic Metabolic Panel:  Recent Labs Lab 07/26/15 2310  NA 135  K 3.6  CL 101  CO2 25  GLUCOSE 207*  BUN 10  CREATININE 0.72  CALCIUM 8.9   GFR: Estimated Creatinine Clearance: 120.8 mL/min (by C-G formula based on Cr of 0.72). Liver Function Tests: No results for input(s): AST, ALT, ALKPHOS, BILITOT, PROT, ALBUMIN in the last 168 hours. No results for input(s): LIPASE, AMYLASE in the last 168 hours. No results for input(s): AMMONIA in the last 168 hours. Coagulation Profile: No results for input(s): INR, PROTIME in the last 168  hours. Cardiac Enzymes:  Recent Labs Lab 07/26/15 2310 07/27/15 0345 07/27/15 0627  TROPONINI <0.03 <0.03 <0.03   BNP (last 3 results) No results for input(s): PROBNP in the last 8760 hours. HbA1C: No results for input(s): HGBA1C in the last 72 hours. CBG:  Recent Labs Lab 07/27/15 0111 07/27/15 0450 07/27/15 0722 07/27/15 1312  GLUCAP 186* 220* 193* 268*   Lipid Profile: No results for input(s): CHOL, HDL, LDLCALC, TRIG, CHOLHDL, LDLDIRECT in the last 72 hours. Thyroid Function Tests: No results for input(s): TSH, T4TOTAL, FREET4, T3FREE, THYROIDAB in the last 72 hours. Anemia Panel: No results for input(s): VITAMINB12, FOLATE, FERRITIN, TIBC, IRON, RETICCTPCT in the last 72 hours. Urine analysis:    Component Value Date/Time   BILIRUBINUR Negative 10/20/2013 1519   PROTEINUR Negative 10/20/2013 1519   UROBILINOGEN 0.2 10/20/2013 1519   NITRITE Negative 10/20/2013 1519   LEUKOCYTESUR Negative 10/20/2013 1519   Sepsis Labs: @LABRCNTIP (procalcitonin:4,lacticidven:4)  )No results found for this or any previous visit (from the past 240 hour(s)).       Radiology Studies: Dg Chest 2 View  07/27/2015  CLINICAL DATA:  Left-sided chest pain since 5:30 this evening. EXAM: CHEST  2 VIEW COMPARISON:  None. FINDINGS: Normal cardiac silhouette and mediastinal contours. No focal parenchymal opacities. No pleural effusion or pneumothorax. No evidence of edema. Mild (under 25%) age-indeterminate compression deformity involving the anterior aspect the inferior endplate of the approximate T11 vertebral body. IMPRESSION: 1.  No acute cardiopulmonary disease. 2. Mild (under 25%) age-indeterminate compression deformity involving the anterior aspect of the inferior endplate of the approximate T11 vertebral body. Correlation point tenderness at this location is recommended. Electronically Signed   By: Sandi Mariscal M.D.   On: 07/27/2015 00:02        Scheduled Meds: . atorvastatin  10  mg Oral Daily  . enoxaparin (LOVENOX) injection  40 mg Subcutaneous Q24H  . ferrous sulfate  325 mg Oral BID WC  . insulin aspart  0-9 Units Subcutaneous Q4H  . insulin detemir  5 Units Subcutaneous QHS  . lisinopril  2.5 mg Oral Daily  . olopatadine  1 drop Left Eye BID   Continuous Infusions:       Time spent: 30 min    Janece Canterbury, MD [facility] Pager [phone]  If 7PM-7AM, please contact night-coverage [email or web address] Password [email or web address] 07/27/2015, 4:51 PM

## 2015-07-27 NOTE — H&P (Signed)
History and Physical    Meghan Finley PPJ:[id number] DOB: 06/23/1970 DOA: 07/26/2015  Referring MD/NP/PA: Dr. Rosana Hoes PCP: Lance Bosch, NP Outpatient Specialists: None Patient coming from: Home  Chief Complaint: Chest pain  HPI: Meghan Finley is a 45 y.o. female with medical history significant of DM2, HTN, HLD, family history of early onset CAD.  Patient presents to the ED with c/o chest pain.  Pain is located in her left chest, onset suddenly, heaviness quality.  Onset 3-4 hours ago, now resolved after 1 hour.  Pain radiated to left arm, there was associated SOB, pain got better on its own with out treatment.  Patient went to UC where they did an EKG and gave ASA and sent her to the ED.  ED Course: Troponin negative, lab work unremarkable, patient remains pain free.  Review of Systems: As per HPI otherwise 10 point review of systems negative.    Past Medical History  Diagnosis Date  . Diabetes mellitus   . Anemia   . Anxiety   . Depression   . Morbid obesity ([place])   . Hyperlipidemia   . Hypertension     Past Surgical History  Procedure Laterality Date  . Tooth extraction       reports that she has never smoked. She has never used smokeless tobacco. She reports that she drinks about 1.0 oz of alcohol per week. She reports that she does not use illicit drugs.  No Known Allergies  Family History  Problem Relation Age of Onset  . Heart disease Mother   . Diabetes Mother   . Heart disease Father   . Asthma Sister   . Diabetes Sister      Prior to Admission medications   Medication Sig Start Date End Date Taking? Authorizing Provider  acetaminophen (TYLENOL) 500 MG tablet Take 500 mg by mouth every 6 (six) hours as needed for pain.    Historical Provider, MD  aspirin 81 MG tablet Take 81 mg by mouth as needed for pain.    Historical Provider, MD  atorvastatin (LIPITOR) 10 MG tablet Take 1 tablet (10 mg total) by mouth daily. 04/21/15   Lance Bosch, NP  Blood  Glucose Monitoring Suppl (ONE TOUCH ULTRA SYSTEM KIT) W/DEVICE KIT 1 kit by Does not apply route once. 03/07/15   Tresa Garter, MD  clonazePAM (KLONOPIN) 1 MG tablet Take 1 mg by mouth 2 (two) times daily as needed for anxiety.    Historical Provider, MD  ferrous sulfate 325 (65 FE) MG tablet TAKE 1 TABLET (325 MG TOTAL) BY MOUTH DAILY WITH BREAKFAST. 01/31/15   Lance Bosch, NP  glipiZIDE (GLUCOTROL) 10 MG tablet Take 1 tablet (10 mg total) by mouth 2 (two) times daily before a meal. 04/21/15   Lance Bosch, NP  glucose blood (ONE TOUCH ULTRA TEST) test strip Use three times daily, before meals, and at bedtime 03/09/15   Tresa Garter, MD  Insulin Detemir (LEVEMIR FLEXTOUCH) 100 UNIT/ML Pen Inject 10 Units into the skin daily at 10 pm. 05/16/15   Lance Bosch, NP  lisinopril (ZESTRIL) 2.5 MG tablet Take 1 tablet (2.5 mg total) by mouth daily. 04/21/15   Lance Bosch, NP  metFORMIN (GLUCOPHAGE) 1000 MG tablet TAKE 1 TABLET BY MOUTH 2 TIMES DAILY WITH A MEAL. 04/21/15   Lance Bosch, NP  metroNIDAZOLE (FLAGYL) 500 MG tablet Take 1 tablet (500 mg total) by mouth 2 (two) times daily. 04/26/15   Mateo Flow  [place], NP  Olopatadine HCl (PATADAY) 0.2 % SOLN Apply 1 drop to eye daily. Left eye 06/15/15   Lance Bosch, NP  [facility] DELICA LANCETS 86V MISC 1 each by Does not apply route 4 (four) times daily -  before meals and at bedtime. 03/07/15   Tresa Garter, MD  Probiotic Product (PROBIOTIC + OMEGA-3 PO) Take by mouth.    Historical Provider, MD  traMADol (ULTRAM) 50 MG tablet Take 1 tablet (50 mg total) by mouth every 6 (six) hours as needed. For pain 06/20/15   Billy Fischer, MD    Physical Exam: Filed Vitals:   07/26/15 2206 07/26/15 2322  BP: 108/58 111/56  Pulse: 85 82  Temp: 97.7 F (36.5 C)   TempSrc: Oral   Resp: 15 20  SpO2: 100% 97%      Constitutional: NAD, calm, comfortable Filed Vitals:   07/26/15 2206 07/26/15 2322  BP: 108/58 111/56  Pulse: 85 82    Temp: 97.7 F (36.5 C)   TempSrc: Oral   Resp: 15 20  SpO2: 100% 97%   Eyes: PERRL, lids and conjunctivae normal ENMT: Mucous membranes are moist. Posterior pharynx clear of any exudate or lesions.Normal dentition.  Neck: normal, supple, no masses, no thyromegaly Respiratory: clear to auscultation bilaterally, no wheezing, no crackles. Normal respiratory effort. No accessory muscle use.  Cardiovascular: Regular rate and rhythm, no murmurs / rubs / gallops. No extremity edema. 2+ pedal pulses. No carotid bruits.  Abdomen: no tenderness, no masses palpated. No hepatosplenomegaly. Bowel sounds positive.  Musculoskeletal: no clubbing / cyanosis. No joint deformity upper and lower extremities. Good ROM, no contractures. Normal muscle tone.  Skin: no rashes, lesions, ulcers. No induration Neurologic: CN 2-12 grossly intact. Sensation intact, DTR normal. Strength 5/5 in all 4.  Psychiatric: Normal judgment and insight. Alert and oriented x 3. Normal mood.    Labs on Admission: I have personally reviewed following labs and imaging studies  CBC:  Recent Labs Lab 07/26/15 2310  WBC 6.8  HGB 12.7  HCT 36.8  MCV 82.1  PLT 784   Basic Metabolic Panel:  Recent Labs Lab 07/26/15 2310  NA 135  K 3.6  CL 101  CO2 25  GLUCOSE 207*  BUN 10  CREATININE 0.72  CALCIUM 8.9   GFR: CrCl cannot be calculated (Unknown ideal weight.). Liver Function Tests: No results for input(s): AST, ALT, ALKPHOS, BILITOT, PROT, ALBUMIN in the last 168 hours. No results for input(s): LIPASE, AMYLASE in the last 168 hours. No results for input(s): AMMONIA in the last 168 hours. Coagulation Profile: No results for input(s): INR, PROTIME in the last 168 hours. Cardiac Enzymes: No results for input(s): CKTOTAL, CKMB, CKMBINDEX, TROPONINI in the last 168 hours. BNP (last 3 results) No results for input(s): PROBNP in the last 8760 hours. HbA1C: No results for input(s): HGBA1C in the last 72  hours. CBG: No results for input(s): GLUCAP in the last 168 hours. Lipid Profile: No results for input(s): CHOL, HDL, LDLCALC, TRIG, CHOLHDL, LDLDIRECT in the last 72 hours. Thyroid Function Tests: No results for input(s): TSH, T4TOTAL, FREET4, T3FREE, THYROIDAB in the last 72 hours. Anemia Panel: No results for input(s): VITAMINB12, FOLATE, FERRITIN, TIBC, IRON, RETICCTPCT in the last 72 hours. Urine analysis:    Component Value Date/Time   BILIRUBINUR Negative 10/20/2013 1519   PROTEINUR Negative 10/20/2013 1519   UROBILINOGEN 0.2 10/20/2013 1519   NITRITE Negative 10/20/2013 1519   LEUKOCYTESUR Negative 10/20/2013 1519   Sepsis  Labs: '@LABRCNTIP'$ (procalcitonin:4,lacticidven:4) )No results found for this or any previous visit (from the past 240 hour(s)).   Radiological Exams on Admission: Dg Chest 2 View  07/27/2015  CLINICAL DATA:  Left-sided chest pain since 5:30 this evening. EXAM: CHEST  2 VIEW COMPARISON:  None. FINDINGS: Normal cardiac silhouette and mediastinal contours. No focal parenchymal opacities. No pleural effusion or pneumothorax. No evidence of edema. Mild (under 25%) age-indeterminate compression deformity involving the anterior aspect the inferior endplate of the approximate T11 vertebral body. IMPRESSION: 1.  No acute cardiopulmonary disease. 2. Mild (under 25%) age-indeterminate compression deformity involving the anterior aspect of the inferior endplate of the approximate T11 vertebral body. Correlation point tenderness at this location is recommended. Electronically Signed   By: Sandi Mariscal M.D.   On: 07/27/2015 00:02    EKG: Independently reviewed.  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

## 2015-07-27 NOTE — Progress Notes (Signed)
Lexiscan injection completed however, there is a possibility that the IV was infiltrated.  Nucmed will check the patients arm for the tracer.  If she was not injected, we will do the stress images tomorrow and resting today after a new IV is placed.  Tarri Fuller PAC

## 2015-07-28 ENCOUNTER — Other Ambulatory Visit (HOSPITAL_COMMUNITY): Payer: Self-pay

## 2015-07-28 ENCOUNTER — Observation Stay (HOSPITAL_BASED_OUTPATIENT_CLINIC_OR_DEPARTMENT_OTHER): Payer: BC Managed Care – PPO

## 2015-07-28 DIAGNOSIS — R079 Chest pain, unspecified: Secondary | ICD-10-CM

## 2015-07-28 DIAGNOSIS — F411 Generalized anxiety disorder: Secondary | ICD-10-CM | POA: Diagnosis not present

## 2015-07-28 DIAGNOSIS — E785 Hyperlipidemia, unspecified: Secondary | ICD-10-CM | POA: Diagnosis not present

## 2015-07-28 DIAGNOSIS — E11319 Type 2 diabetes mellitus with unspecified diabetic retinopathy without macular edema: Secondary | ICD-10-CM | POA: Diagnosis not present

## 2015-07-28 DIAGNOSIS — I517 Cardiomegaly: Secondary | ICD-10-CM

## 2015-07-28 LAB — NM MYOCAR MULTI W/SPECT W/WALL MOTION / EF
CHL CUP MPHR: 178 {beats}/min
CHL CUP NUCLEAR SRS: 8
CHL CUP RESTING HR STRESS: 81 {beats}/min
CSEPEDS: 0 s
CSEPEW: 1 METS
Exercise duration (min): 0 min
LHR: 0.31
LV sys vol: 23 mL
LVDIAVOL: 64 mL (ref 46–106)
NUC STRESS TID: 1.18
Peak HR: 107 {beats}/min
Percent HR: 60 %
SDS: 3
SSS: 11

## 2015-07-28 LAB — CBC
HCT: 34.4 % — ABNORMAL LOW (ref 36.0–46.0)
Hemoglobin: 11.8 g/dL — ABNORMAL LOW (ref 12.0–15.0)
MCH: 28.6 pg (ref 26.0–34.0)
MCHC: 34.3 g/dL (ref 30.0–36.0)
MCV: 83.5 fL (ref 78.0–100.0)
PLATELETS: 281 10*3/uL (ref 150–400)
RBC: 4.12 MIL/uL (ref 3.87–5.11)
RDW: 12.7 % (ref 11.5–15.5)
WBC: 6.4 10*3/uL (ref 4.0–10.5)

## 2015-07-28 LAB — LIPID PANEL
CHOLESTEROL: 128 mg/dL (ref 0–200)
HDL: 38 mg/dL — ABNORMAL LOW (ref >40)
LDL Cholesterol: 76 mg/dL (ref 0–99)
Total CHOL/HDL Ratio: 3.4 RATIO
Triglycerides: 70 mg/dL (ref <150)
VLDL: 14 mg/dL (ref 0–40)

## 2015-07-28 LAB — BASIC METABOLIC PANEL
Anion gap: 10 (ref 5–15)
BUN: 12 mg/dL (ref 6–20)
CALCIUM: 8.8 mg/dL — AB (ref 8.9–10.3)
CO2: 23 mmol/L (ref 22–32)
CREATININE: 0.66 mg/dL (ref 0.44–1.00)
Chloride: 102 mmol/L (ref 101–111)
GFR calc Af Amer: >60 mL/min (ref >60)
GFR calc non Af Amer: >60 mL/min (ref >60)
Glucose, Bld: 221 mg/dL — ABNORMAL HIGH (ref 65–99)
Potassium: 3.9 mmol/L (ref 3.5–5.1)
Sodium: 135 mmol/L (ref 135–145)

## 2015-07-28 LAB — GLUCOSE, CAPILLARY
GLUCOSE-CAPILLARY: 208 mg/dL — AB (ref 65–99)
GLUCOSE-CAPILLARY: 207 mg/dL — AB (ref 65–99)
Glucose-Capillary: 219 mg/dL — ABNORMAL HIGH (ref 65–99)
Glucose-Capillary: 274 mg/dL — ABNORMAL HIGH (ref 65–99)

## 2015-07-28 LAB — ECHOCARDIOGRAM COMPLETE
Height: 64 in
Weight: 4446.4 oz

## 2015-07-28 MED ORDER — INSULIN DETEMIR 100 UNIT/ML ~~LOC~~ SOLN
10.0000 [IU] | Freq: Every day | SUBCUTANEOUS | Status: DC
  Filled 2015-07-28: qty 0.1

## 2015-07-28 MED ORDER — REGADENOSON 0.4 MG/5ML IV SOLN
INTRAVENOUS | Status: AC
  Filled 2015-07-28: qty 5

## 2015-07-28 MED ORDER — INSULIN ASPART 100 UNIT/ML ~~LOC~~ SOLN
0.0000 [IU] | Freq: Three times a day (TID) | SUBCUTANEOUS | Status: DC
  Administered 2015-07-28: 8 [IU] via SUBCUTANEOUS

## 2015-07-28 MED ORDER — ATORVASTATIN CALCIUM 20 MG PO TABS: 20.0000 mg | ORAL_TABLET | Freq: Every day | ORAL | Status: DC

## 2015-07-28 MED ORDER — TECHNETIUM TC 99M SESTAMIBI - CARDIOLITE
30.0000 | Freq: Once | INTRAVENOUS | Status: AC | PRN
  Administered 2015-07-28: 30 via INTRAVENOUS

## 2015-07-28 MED ORDER — REGADENOSON 0.4 MG/5ML IV SOLN
0.4000 mg | Freq: Once | INTRAVENOUS | Status: AC
  Administered 2015-07-28: 0.4 mg via INTRAVENOUS
  Filled 2015-07-28: qty 5

## 2015-07-28 MED ORDER — TECHNETIUM TC 99M SESTAMIBI - CARDIOLITE
30.0000 | Freq: Once | INTRAVENOUS | Status: AC | PRN
  Administered 2015-07-28: 11:00:00 30 via INTRAVENOUS

## 2015-07-28 MED ORDER — INSULIN ASPART 100 UNIT/ML ~~LOC~~ SOLN: 0.0000 [IU] | Freq: Every day | SUBCUTANEOUS | Status: DC

## 2015-07-28 MED FILL — ATORVASTATIN 20 MG TABLET: 20 | 30 days supply | Qty: 30 | Fill #0

## 2015-07-28 NOTE — Progress Notes (Signed)
  Echocardiogram 2D Echocardiogram has been performed.  Diamond Nickel 07/28/2015, 3:26 PM

## 2015-07-28 NOTE — Discharge Instructions (Signed)

## 2015-07-28 NOTE — Progress Notes (Signed)
Pt discharged home. Discharge instructions have been gone over with the patient. IV's removed. Pt given unit number and told to call if they have any concerns regarding their discharge instructions. Harold Mattes V, RN   

## 2015-07-28 NOTE — Progress Notes (Signed)
   There was no ST segment deviation noted during stress.  No T wave inversion was noted during stress.  This is a low risk study. No ischemia. Normal EF 64%  Noted negative myoview above.  Hilbert Corrigan PA Pager: [phone]

## 2015-07-28 NOTE — Progress Notes (Signed)
Results for ANZLEE, SCHREITER (MRN [id number]) as of 07/28/2015 09:47  Ref. Range 07/27/2015 16:53 07/27/2015 19:44 07/27/2015 23:54 07/28/2015 04:46 07/28/2015 07:45  Glucose-Capillary Latest Ref Range: 65-99 mg/dL 246 (H) 244 (H) 208 (H) 219 (H) 208 (H)  Noted that patient's CBGs continue to be greater than 180 mg/dl.  Recommend increasing Levemir to 8-10 units daily.  Levemir 10 units daily is the dosage that patient takes at home.  Continue Novolog correction scale every 4 hours while NPO and then TID & HS when eating.  Will continue to monitor blood sugars while in the hospital. Harvel Ricks RN BSN CDE

## 2015-07-28 NOTE — Discharge Summary (Signed)
Physician Discharge Summary  Meghan Finley HYW:[id number] DOB: 05-06-1970 DOA: 07/26/2015  PCP: Lance Bosch, NP  Admit date: 07/26/2015 Discharge date: 07/28/2015  Recommendations for Outpatient Follow-up:  1. Follow-up with primary care doctor in 1-2 weeks 2. Biweekly counseling 3. Consider addition of SSRI per primary care doctor's discretion 4. 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 female with medical history significant of DM2, HTN, HLD, family history of early onset CAD. Patient presented to the ED with c/o chest pain. Over the last few months she has had intermittent chest heaviness particularly with exertion. On the date of admission, she had sudden onset of pressure in her left chest associated with some radiation to her left arm and shortness of breath with nausea. Symptoms lasted up to an hour and eventually resolved. She went to urgent care with a gave her an aspirin and send her to the emergency department.   Hospital Course:   Chest pain, typical and atypical features.  Her heart score was 4.  Her troponins remained negative.  Her LDL was 76 and her HDL was 38.  Recommended increasing her statin to '20mg'$  daily.  A1c is pending.  Her ECHO demonstrated mild LVH, preserved EF 60-65% and grade 1 DD.  She underwent NM stress test which demonstrated no ischemia, EF 64%.  She had no ST segment deviation or  T-wave inversions during her exam.  I doubt PE given lack of tachycardia, tachypnea, hypoxia, pleuritic chest pain, leg pain or swelling, sedentary lifestyle, recent travel, malignancy, or recent surgery.  I suspect that at least some of her symptoms are due to anxiety and stress and she should follow up with her PCP for additional management of these conditions and resume her visits with her counselor.    Essential hypertension and hyperlipidemia, her BP remained stable.  Given her mildly low HDL and LDL above 70, recommended increasing her statin slightly.  She continued lisinopril at her current dose.    Diabetes mellitus type 2, uncontrolled, A1c pending.  She was given levemir and SSI during hospitalization, but should resume her previous regimen at discharge with close follow up with her primary care doctor for further management.    Generalized anxiety disorder, may be contributing to her symptoms of chest pain.  She continued clonazepam, but please consider the addition of an SSRI at discretion of PCP.    Fibroids, menorrhagia, patient perseverates on these issues and on whether they may have caused her SOB.  Doubted that this was the case and advised her to keep her routine follow up with her gynecologist to discuss treatment options for her fibroids.    Procedures:  ECHO  NM stress test  Consultations:  None  Discharge Exam: Filed Vitals:   07/28/15 1012 07/28/15 1145  BP: 112/75 123/71  Pulse:  79  Temp:  97.8 F (36.6 C)  Resp:  18  Filed Vitals:   07/28/15 1008 07/28/15 1009 07/28/15 1012 07/28/15 1145  BP: 117/64 123/75 112/75 123/71  Pulse:    79  Temp:    97.8 F (36.6 C)  TempSrc:    Oral  Resp:    18  Height:      Weight:      SpO2:    100%    General: adult female, obese, NAD Cardiovascular: RRR, no mrg Respiratory: CTAB, no increased work of breathing ABD:  Normal bowel sounds, nontender, nondistended MSK:  No lower extremity edema, normal tone and  bulk  Neuro:  Grossly intact  Discharge Instructions      Discharge Instructions    Call MD for:  difficulty breathing, headache or visual disturbances    Complete by:  As directed      Call MD for:  extreme fatigue    Complete by:  As directed      Call MD for:  hives    Complete by:  As directed      Call MD for:  persistant dizziness or light-headedness    Complete by:  As directed      Call MD for:  persistant nausea and vomiting    Complete by:  As directed      Call MD for:  severe uncontrolled pain    Complete by:  As directed      Call MD for:  temperature >100.4    Complete by:  As directed      Diet - low sodium heart healthy    Complete by:  As directed      Diet Carb Modified    Complete by:  As directed      Increase activity slowly    Complete by:  As directed             Medication List    TAKE these medications        acetaminophen 500 MG tablet  Commonly known as:  TYLENOL  Take 500 mg by mouth every 6 (six) hours as needed for pain.     atorvastatin 20 MG tablet  Commonly known as:  LIPITOR  Take 1 tablet (20 mg total) by mouth daily.     clonazePAM 1 MG tablet  Commonly known as:  KLONOPIN  Take 1 mg by mouth 2 (two) times daily as needed for anxiety.     ferrous sulfate 325 (65 FE) MG tablet  TAKE 1 TABLET (325 MG TOTAL) BY MOUTH DAILY WITH BREAKFAST.     fluticasone 50 MCG/ACT nasal spray  Commonly known as:  FLONASE  Place 1 spray into both nostrils daily.     glipiZIDE 10 MG tablet  Commonly known as:  GLUCOTROL  Take 1 tablet (10 mg total) by mouth 2 (two) times daily before a meal.     glucose blood test strip  Commonly known as:  ONE TOUCH ULTRA TEST  Use three times daily, before meals, and at bedtime     Insulin Detemir 100 UNIT/ML Pen  Commonly known as:  LEVEMIR FLEXTOUCH  Inject 10 Units into the skin daily at 10 pm.     lisinopril 2.5 MG tablet  Commonly known as:  ZESTRIL  Take 1 tablet (2.5 mg total) by mouth  daily.     metFORMIN 1000 MG tablet  Commonly known as:  GLUCOPHAGE  TAKE 1 TABLET BY MOUTH 2 TIMES DAILY WITH A MEAL.     Olopatadine HCl 0.2 % Soln  Commonly  known as:  PATADAY  Apply 1 drop to eye daily. Left eye     omega-3 acid ethyl esters 1 g capsule  Commonly known as:  LOVAZA  Take 2 g by mouth daily.     ONE TOUCH ULTRA SYSTEM KIT w/Device Kit  1 kit by Does not apply route once.     ONETOUCH DELICA LANCETS 77O Misc  1 each by Does not apply route 4 (four) times daily -  before meals and at bedtime.     PROBIOTIC + OMEGA-3 PO  Take 1 tablet by mouth daily.     traMADol 50 MG tablet  Commonly known as:  ULTRAM  Take 1 tablet (50 mg total) by mouth every 6 (six) hours as needed. For pain     Vitamin D-3 1000 units Caps  Take 2 capsules by mouth daily.       Follow-up Information    Follow up with Lance Bosch, NP. Schedule an appointment as soon as possible for a visit in 1 week.   Specialty:  Internal Medicine   Contact information:   [address] [place] [address] [phone]        The results of significant diagnostics from this hospitalization (including imaging, microbiology, ancillary and laboratory) are listed below for reference.    Significant Diagnostic Studies: Dg Chest 2 View  07/27/2015  CLINICAL DATA:  Left-sided chest pain since 5:30 this evening. EXAM: CHEST  2 VIEW COMPARISON:  None. FINDINGS: Normal cardiac silhouette and mediastinal contours. No focal parenchymal opacities. No pleural effusion or pneumothorax. No evidence of edema. Mild (under 25%) age-indeterminate compression deformity involving the anterior aspect the inferior endplate of the approximate T11 vertebral body. IMPRESSION: 1.  No acute cardiopulmonary disease. 2. Mild (under 25%) age-indeterminate compression deformity involving the anterior aspect of the inferior endplate of the approximate T11 vertebral body. Correlation point tenderness at this location is  recommended. Electronically Signed   By: Sandi Mariscal M.D.   On: 07/27/2015 00:02   Nm Myocar Multi W/spect W/wall Motion / Ef  07/28/2015   There was no ST segment deviation noted during stress.  No T wave inversion was noted during stress.  This is a low risk study. No ischemia. Normal EF 64%  SKAINS, MARK, MD    Microbiology: No results found for this or any previous visit (from the past 240 hour(s)).   Labs: Basic Metabolic Panel:  Recent Labs Lab 07/26/15 2310 07/28/15 0315  NA 135 135  K 3.6 3.9  CL 101 102  CO2 25 23  GLUCOSE 207* 221*  BUN 10 12  CREATININE 0.72 0.66  CALCIUM 8.9 8.8*   Liver Function Tests: No results for input(s): AST, ALT, ALKPHOS, BILITOT, PROT, ALBUMIN in the last 168 hours. No results for input(s): LIPASE, AMYLASE in the last 168 hours. No results for input(s): AMMONIA in the last 168 hours. CBC:  Recent Labs Lab 07/26/15 2310 07/28/15 0315  WBC 6.8 6.4  HGB 12.7 11.8*  HCT 36.8 34.4*  MCV 82.1 83.5  PLT 290 281   Cardiac Enzymes:  Recent Labs Lab 07/26/15 2310 07/27/15 0345 07/27/15 0627  TROPONINI <0.03 <0.03 <0.03   BNP: BNP (last 3 results) No results for input(s): BNP in the last 8760 hours.  ProBNP (last 3 results) No results for input(s): PROBNP in the last 8760 hours.  CBG:  Recent Labs Lab 07/27/15 2354 07/28/15 0446 07/28/15 0745 07/28/15 1147 07/28/15 1647  GLUCAP 208* 219* 208* 207*  274*    Time coordinating discharge: 35 minutes  Signed:  Holten Spano  [facility] 07/28/2015, 5:51 PM

## 2015-07-28 NOTE — Progress Notes (Signed)
Part 2 (stress portion) of 2 day stress test completed. She had resting portion yesterday. Pending final result by [facility] Reader this afternoon.   Hilbert Corrigan PA Pager: [phone]

## 2015-07-29 LAB — HEMOGLOBIN A1C
Hgb A1c MFr Bld: 9.9 % — ABNORMAL HIGH (ref 4.8–5.6)
Mean Plasma Glucose: 237 mg/dL

## 2015-07-29 MED FILL — clonazePAM 1 MG TABS: 1 | 30 days supply | Qty: 60 | Fill #2

## 2015-08-08 MED FILL — metFORMIN HCL 1000 MG TABS: 1000 | 30 days supply | Qty: 60 | Fill #3

## 2015-08-08 MED FILL — glipiZIDE 10 MG TABS: 10 | 30 days supply | Qty: 60 | Fill #1

## 2015-08-17 ENCOUNTER — Other Ambulatory Visit: Payer: Self-pay | Admitting: Internal Medicine

## 2015-08-17 MED FILL — LEVEMIR FLEXTOUCH 100 UNITS: 100 | 30 days supply | Qty: 3 | Fill #1

## 2015-08-17 MED FILL — LISINOPRIL 5 MG TABLET: 5 | 30 days supply | Qty: 15 | Fill #0

## 2015-08-22 ENCOUNTER — Inpatient Hospital Stay: Payer: Self-pay | Admitting: Family Medicine

## 2015-08-23 ENCOUNTER — Ambulatory Visit: Payer: BC Managed Care – PPO | Attending: Family Medicine | Admitting: Family Medicine

## 2015-08-23 ENCOUNTER — Encounter: Payer: Self-pay | Admitting: Family Medicine

## 2015-08-23 VITALS — BP 102/66 | HR 88 | Temp 98.0°F | Resp 12 | Ht 64.0 in | Wt 282.6 lb

## 2015-08-23 DIAGNOSIS — F411 Generalized anxiety disorder: Secondary | ICD-10-CM | POA: Diagnosis not present

## 2015-08-23 DIAGNOSIS — I10 Essential (primary) hypertension: Secondary | ICD-10-CM

## 2015-08-23 DIAGNOSIS — E785 Hyperlipidemia, unspecified: Secondary | ICD-10-CM

## 2015-08-23 DIAGNOSIS — E669 Obesity, unspecified: Secondary | ICD-10-CM | POA: Diagnosis not present

## 2015-08-23 DIAGNOSIS — E119 Type 2 diabetes mellitus without complications: Secondary | ICD-10-CM | POA: Diagnosis not present

## 2015-08-23 DIAGNOSIS — R252 Cramp and spasm: Secondary | ICD-10-CM

## 2015-08-23 LAB — GLUCOSE, POCT (MANUAL RESULT ENTRY): POC Glucose: 204 mg/dl — AB (ref 70–99)

## 2015-08-23 MED ORDER — INSULIN DETEMIR 100 UNIT/ML FLEXPEN: 15.0000 [IU] | PEN_INJECTOR | Freq: Every day | SUBCUTANEOUS | Status: DC

## 2015-08-23 MED ORDER — LISINOPRIL 5 MG PO TABS: 2.5000 mg | ORAL_TABLET | Freq: Every day | ORAL | Status: DC

## 2015-08-23 MED ORDER — MELOXICAM 7.5 MG PO TABS: 7.5000 mg | ORAL_TABLET | Freq: Every day | ORAL | Status: DC

## 2015-08-23 NOTE — Progress Notes (Signed)
Pt here for HFU and DM. Pt reports pain in right hand pain described as cramping and stiffness. Pt has taken her morning medications today. Pt needs a refill on lisinopril. Pt CBG is 204. Pt last ate at 1pm and had 2 slices of pizza.

## 2015-08-23 NOTE — Progress Notes (Signed)
Subjective:  Patient ID: Meghan Finley, female    DOB: [birth year]  Age: 45 y.o. MRN: [id number]  CC: Hospitalization Follow-up   HPI Meghan Finley is a 45 year old female with a history of type 2 diabetes mellitus, hypertension, hyperlipidemia, anxiety who presents after hospitalization for chest pain at [facility] from 07/26/15 through 07/28/15  She was ruled out for acute coronary syndrome and cardiac workup by means of Myoview revealed no ST segment deviation, no ischemia, low risk study. Chest pain was thought to be secondary to underlying anxiety and stress.  She denies chest pain or shortness of breath at this time but complains of cramps in her right hand which occur around the time of her period and resolves afterwards. This is associated with swelling of her MCP joints. 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 She also uses 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  Outpatient Prescriptions Prior to Visit  Medication Sig Dispense Refill  . acetaminophen (TYLENOL) 500 MG tablet Take 500 mg by mouth every 6 (six) hours as needed for pain.    Marland Kitchen atorvastatin (LIPITOR) 20 MG tablet Take 1 tablet (20 mg total) by mouth daily. 30 tablet 0  . Blood Glucose Monitoring Suppl (ONE TOUCH ULTRA SYSTEM KIT) W/DEVICE KIT 1 kit by Does not apply route once. 1 each 0  . Cholecalciferol (VITAMIN D-3) 1000 units CAPS Take 2 capsules by mouth daily.    . clonazePAM (KLONOPIN) 1 MG tablet Take 1 mg by mouth 2 (two) times daily as needed for anxiety.    . ferrous sulfate 325 (65 FE) MG tablet TAKE 1 TABLET (325 MG TOTAL) BY MOUTH DAILY WITH BREAKFAST. 90 tablet 3  . fluticasone (FLONASE) 50 MCG/ACT nasal spray Place 1 spray into both nostrils daily.    Marland Kitchen  glipiZIDE (GLUCOTROL) 10 MG tablet Take 1 tablet (10 mg total) by mouth 2 (two) times daily before a meal. 60 tablet 2  . glucose blood (ONE TOUCH ULTRA TEST) test strip Use three times daily, before meals, and at bedtime 100 each 12  . metFORMIN (GLUCOPHAGE) 1000 MG tablet TAKE 1 TABLET BY MOUTH 2 TIMES DAILY WITH A MEAL. 60 tablet 4  . omega-3 acid ethyl esters (LOVAZA) 1 g capsule Take 2 g by mouth daily.    Glory Rosebush DELICA LANCETS 01B MISC 1 each by Does not apply route 4 (four) times daily -  before meals and at bedtime. 100 each 11  . Insulin Detemir (LEVEMIR FLEXTOUCH) 100 UNIT/ML Pen Inject 10 Units into the skin daily at 10 pm. (Patient taking differently: Inject 10 Units into the skin every morning. ) 15 mL 11  . lisinopril (PRINIVIL,ZESTRIL) 5 MG tablet TAKE 1/2 TABLET BY MOUTH DAILY 15 tablet 2  . Olopatadine HCl (PATADAY) 0.2 % SOLN Apply 1 drop to eye daily. Left eye (Patient not taking: Reported on 08/23/2015) 2.5 mL 0  . Probiotic Product (PROBIOTIC + OMEGA-3 PO) Take 1 tablet by mouth daily. Reported on 08/23/2015    . traMADol (ULTRAM) 50 MG tablet Take 1 tablet (50 mg total) by mouth every 6 (six) hours as needed. For pain (Patient not taking: Reported on 08/23/2015) 10 tablet 0   No facility-administered medications prior  to visit.    ROS Review of Systems  Constitutional: Negative for activity change, appetite change and fatigue.  HENT: Negative for congestion, sinus pressure and sore throat.   Eyes: Negative for visual disturbance.  Respiratory: Negative for cough, chest tightness, shortness of breath and wheezing.   Cardiovascular: Negative for chest pain and palpitations.  Gastrointestinal: Negative for abdominal pain, constipation and abdominal distention.  Endocrine: Negative for polydipsia.  Genitourinary: Negative for dysuria and frequency.  Musculoskeletal:       See hpi  Skin: Negative for rash.  Neurological: Negative for tremors, light-headedness and  numbness.  Hematological: Does not bruise/bleed easily.  Psychiatric/Behavioral: Negative for behavioral problems and agitation.       Positive for anxiety    Objective:  BP 102/66 mmHg  Pulse 88  Temp(Src) 98 F (36.7 C) (Oral)  Resp 12  Ht _0  (1.626 m)  Wt 282 lb 9.6 oz (128.187 kg)  BMI 48.48 kg/m2  SpO2 99%  LMP 07/27/2015  BP/Weight 08/23/2015 11/26/8180 12/03/3714  Systolic BP 967 893 -  Diastolic BP 66 71 -  Wt. (Lbs) 282.6 277.9 -  BMI 48.48 - 47.68      Physical Exam  Constitutional: She is oriented to person, place, and time. She appears well-developed and well-nourished.  Obese  Cardiovascular: Normal rate, normal heart sounds and intact distal pulses.   No murmur heard. Pulmonary/Chest: Effort normal and breath sounds normal. She has no wheezes. She has no rales. She exhibits no tenderness.  Abdominal: Soft. Bowel sounds are normal. She exhibits no distension and no mass. There is no tenderness.  Musculoskeletal: She exhibits edema (edema of middle DIP joint of right hand) and tenderness (extension of middle finger of right hand is resricted to 160 degrees).  Neurological: She is alert and oriented to person, place, and time.    Lipid Panel     Component Value Date/Time   CHOL 128 07/28/2015 0315   TRIG 70 07/28/2015 0315   HDL 38* 07/28/2015 0315   CHOLHDL 3.4 07/28/2015 0315   VLDL 14 07/28/2015 0315   LDLCALC 76 07/28/2015 0315     Lab Results  Component Value Date   HGBA1C 9.9* 07/28/2015    Assessment & Plan:   1. HLD (hyperlipidemia) Controlled Continue atorvastatin  2. Essential hypertension Controlled  3. 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 She is managed by psychiatry and I have discussed with her the need for low dose SSRI as she is currently using [place]  4.  Obesity Discussed exercising 30 minutes 5 days of the week  5. Type 2 diabetes mellitus not at goal [facility]) Uncontrolled with A1c of 9.9 which is above goal of less than 7 Increase Levemir to 15 units daily and we have discussed measures to ensure compliance. She is interested in being placed on Victoza once she has exhausted this month supply of Levemir Victoza will also help with weight loss - Glucose (CBG)  6. Hand cramp Placed on meloxicam to be used around menstrual periods when she has hand cramps.   Meds ordered this encounter  Medications  . Insulin Detemir (LEVEMIR FLEXTOUCH) 100 UNIT/ML Pen    Sig: Inject 15 Units into the skin daily at 10 pm.    Dispense:  15 mL    Refill:  1  . meloxicam (MOBIC) 7.5 MG tablet    Sig: Take 1 tablet (  7.5 mg total) by mouth daily.    Dispense:  30 tablet    Refill:  0  . lisinopril (PRINIVIL,ZESTRIL) 5 MG tablet    Sig: Take 0.5 tablets (2.5 mg total) by mouth daily.    Dispense:  15 tablet    Refill:  2    Follow-up: Return in about 3 weeks (around 09/13/2015) for Follow-up on diabetes mellitus.   Arnoldo Morale MD

## 2015-08-23 NOTE — Patient Instructions (Signed)
Diabetes Mellitus and Food It is important for you to manage your blood sugar (glucose) level. Your blood glucose level can be greatly affected by what you eat. Eating healthier foods in the appropriate amounts throughout the day at about the same time each day will help you control your blood glucose level. It can also help slow or prevent worsening of your diabetes mellitus. Healthy eating may even help you improve the level of your blood pressure and reach or maintain a healthy weight.  General recommendations for healthful eating and cooking habits include:  Eating meals and snacks regularly. Avoid going long periods of time without eating to lose weight.  Eating a diet that consists mainly of plant-based foods, such as fruits, vegetables, nuts, legumes, and whole grains.  Using low-heat cooking methods, such as baking, instead of high-heat cooking methods, such as deep frying. Work with your dietitian to make sure you understand how to use the Nutrition Facts information on food labels. HOW CAN FOOD AFFECT ME? Carbohydrates Carbohydrates affect your blood glucose level more than any other type of food. Your dietitian will help you determine how many carbohydrates to eat at each meal and teach you how to count carbohydrates. Counting carbohydrates is important to keep your blood glucose at a healthy level, especially if you are using insulin or taking certain medicines for diabetes mellitus. Alcohol Alcohol can cause sudden decreases in blood glucose (hypoglycemia), especially if you use insulin or take certain medicines for diabetes mellitus. Hypoglycemia can be a life-threatening condition. Symptoms of hypoglycemia (sleepiness, dizziness, and disorientation) are similar to symptoms of having too much alcohol.  If your health care provider has given you approval to drink alcohol, do so in moderation and use the following guidelines:  Women should not have more than one drink per day, and men  should not have more than two drinks per day. One drink is equal to:  12 oz of beer.  5 oz of wine.  1 oz of hard liquor.  Do not drink on an empty stomach.  Keep yourself hydrated. Have water, diet soda, or unsweetened iced tea.  Regular soda, juice, and other mixers might contain a lot of carbohydrates and should be counted. WHAT FOODS ARE NOT RECOMMENDED? As you make food choices, it is important to remember that all foods are not the same. Some foods have fewer nutrients per serving than other foods, even though they might have the same number of calories or carbohydrates. It is difficult to get your body what it needs when you eat foods with fewer nutrients. Examples of foods that you should avoid that are high in calories and carbohydrates but low in nutrients include:  Trans fats (most processed foods list trans fats on the Nutrition Facts label).  Regular soda.  Juice.  Candy.  Sweets, such as cake, pie, doughnuts, and cookies.  Fried foods. WHAT FOODS CAN I EAT? Eat nutrient-rich foods, which will nourish your body and keep you healthy. The food you should eat also will depend on several factors, including:  The calories you need.  The medicines you take.  Your weight.  Your blood glucose level.  Your blood pressure level.  Your cholesterol level. You should eat a variety of foods, including:  Protein.  Lean cuts of meat.  Proteins low in saturated fats, such as fish, egg whites, and beans. Avoid processed meats.  Fruits and vegetables.  Fruits and vegetables that may help control blood glucose levels, such as apples, mangoes, and   yams.  Dairy products.  Choose fat-free or low-fat dairy products, such as milk, yogurt, and cheese.  Grains, bread, pasta, and rice.  Choose whole grain products, such as multigrain bread, whole oats, and brown rice. These foods may help control blood pressure.  Fats.  Foods containing healthful fats, such as nuts,  avocado, olive oil, canola oil, and fish. DOES EVERYONE WITH DIABETES MELLITUS HAVE THE SAME MEAL PLAN? Because every person with diabetes mellitus is different, there is not one meal plan that works for everyone. It is very important that you meet with a dietitian who will help you create a meal plan that is just right for you.   This information is not intended to replace advice given to you by your health care provider. Make sure you discuss any questions you have with your health care provider.   Document Released: 12/07/2004 Document Revised: 04/02/2014 Document Reviewed: 02/06/2013 Elsevier Interactive Patient Education 2016 [employer]  

## 2015-08-29 ENCOUNTER — Telehealth: Payer: Self-pay | Admitting: Family Medicine

## 2015-08-29 MED FILL — clonazePAM 1 MG TABS: 1 | 30 days supply | Qty: 60 | Fill #0

## 2015-08-29 MED FILL — MELOXICAM 7.5 MG TABLET: 7.5 | 30 days supply | Qty: 30 | Fill #0

## 2015-08-29 NOTE — Telephone Encounter (Signed)
Patient came into office requesting f/up appt due to her having vaginal discomfort( discharge/itching) . Called patient to schedule appt this Friday 09/02/15 to schedule appt

## 2015-09-02 ENCOUNTER — Ambulatory Visit: Payer: BC Managed Care – PPO | Attending: Family Medicine | Admitting: Family Medicine

## 2015-09-02 ENCOUNTER — Other Ambulatory Visit: Payer: Self-pay

## 2015-09-02 ENCOUNTER — Other Ambulatory Visit (HOSPITAL_COMMUNITY)
Admission: RE | Admit: 2015-09-02 | Discharge: 2015-09-02 | Disposition: A | Discharge status: Home / Self Care | Payer: BC Managed Care – PPO | Source: Ambulatory Visit | Attending: Family Medicine | Admitting: Family Medicine

## 2015-09-02 ENCOUNTER — Encounter: Payer: Self-pay | Admitting: Family Medicine

## 2015-09-02 VITALS — BP 135/83 | HR 91 | Temp 98.2°F | Resp 18 | Ht 64.0 in | Wt 287.6 lb

## 2015-09-02 DIAGNOSIS — E785 Hyperlipidemia, unspecified: Secondary | ICD-10-CM | POA: Diagnosis not present

## 2015-09-02 DIAGNOSIS — Z794 Long term (current) use of insulin: Secondary | ICD-10-CM | POA: Diagnosis not present

## 2015-09-02 DIAGNOSIS — N760 Acute vaginitis: Secondary | ICD-10-CM | POA: Insufficient documentation

## 2015-09-02 DIAGNOSIS — Z113 Encounter for screening for infections with a predominantly sexual mode of transmission: Secondary | ICD-10-CM | POA: Diagnosis present

## 2015-09-02 DIAGNOSIS — N898 Other specified noninflammatory disorders of vagina: Secondary | ICD-10-CM

## 2015-09-02 DIAGNOSIS — E1165 Type 2 diabetes mellitus with hyperglycemia: Secondary | ICD-10-CM

## 2015-09-02 LAB — GLUCOSE, POCT (MANUAL RESULT ENTRY): POC GLUCOSE: 198 mg/dL — AB (ref 70–99)

## 2015-09-02 MED ORDER — ATORVASTATIN CALCIUM 20 MG PO TABS: 20.0000 mg | ORAL_TABLET | Freq: Every day | ORAL | Status: DC

## 2015-09-02 MED FILL — ATORVASTATIN 20 MG TABLET: 20 | 30 days supply | Qty: 30 | Fill #0

## 2015-09-02 NOTE — Progress Notes (Signed)
Patient here for vaginal discharge. Medication refills.

## 2015-09-02 NOTE — Progress Notes (Signed)
Subjective:  Patient ID: Meghan Finley, female    DOB: [birth year]  Age: 45 y.o. MRN: [id number]  CC: Vaginal Discharge and Medication Refill   HPI Meghan Finley is a 45 year old female with a history of type 2 diabetes mellitus (A1c 9.9), hypertension, hyperlipidemia who comes in for an acute visit complaining of vaginal discharge for a few days. Denies itching or vaginal odor and has no urinary symptoms. She was treated for bacterial vaginosis in 03/2015 and she is wondering if this is a repeat.  Outpatient Prescriptions Prior to Visit  Medication Sig Dispense Refill  . Blood Glucose Monitoring Suppl (ONE TOUCH ULTRA SYSTEM KIT) W/DEVICE KIT 1 kit by Does not apply route once. 1 each 0  . Cholecalciferol (VITAMIN D-3) 1000 units CAPS Take 2 capsules by mouth daily.    . clonazePAM (KLONOPIN) 1 MG tablet Take 1 mg by mouth 2 (two) times daily as needed for anxiety.    . ferrous sulfate 325 (65 FE) MG tablet TAKE 1 TABLET (325 MG TOTAL) BY MOUTH DAILY WITH BREAKFAST. 90 tablet 3  . fluticasone (FLONASE) 50 MCG/ACT nasal spray Place 1 spray into both nostrils daily.    Marland Kitchen glipiZIDE (GLUCOTROL) 10 MG tablet Take 1 tablet (10 mg total) by mouth 2 (two) times daily before a meal. 60 tablet 2  . glucose blood (ONE TOUCH ULTRA TEST) test strip Use three times daily, before meals, and at bedtime 100 each 12  . Insulin Detemir (LEVEMIR FLEXTOUCH) 100 UNIT/ML Pen Inject 15 Units into the skin daily at 10 pm. 15 mL 1  . lisinopril (PRINIVIL,ZESTRIL) 5 MG tablet Take 0.5 tablets (2.5 mg total) by mouth daily. 15 tablet 2  . meloxicam (MOBIC) 7.5 MG tablet Take 1 tablet (7.5 mg total) by mouth daily. 30 tablet 0  . metFORMIN (GLUCOPHAGE) 1000 MG tablet TAKE 1 TABLET BY MOUTH 2 TIMES DAILY WITH A MEAL. 60 tablet 4  . Olopatadine HCl (PATADAY) 0.2 % SOLN Apply 1 drop to eye daily. Left eye 2.5 mL 0  . omega-3 acid ethyl esters (LOVAZA) 1 g capsule Take 2 g by mouth daily.    Glory Rosebush DELICA LANCETS 76H  MISC 1 each by Does not apply route 4 (four) times daily -  before meals and at bedtime. 100 each 11  . Probiotic Product (PROBIOTIC + OMEGA-3 PO) Take 1 tablet by mouth daily. Reported on 08/23/2015    . atorvastatin (LIPITOR) 20 MG tablet Take 1 tablet (20 mg total) by mouth daily. 30 tablet 0  . traMADol (ULTRAM) 50 MG tablet Take 1 tablet (50 mg total) by mouth every 6 (six) hours as needed. For pain (Patient not taking: Reported on 08/23/2015) 10 tablet 0  . acetaminophen (TYLENOL) 500 MG tablet Take 500 mg by mouth every 6 (six) hours as needed for pain.     No facility-administered medications prior to visit.    ROS Review of Systems  Constitutional: Negative for activity change and appetite change.  HENT: Negative for sinus pressure and sore throat.   Respiratory: Negative for chest tightness, shortness of breath and wheezing.   Cardiovascular: Negative for chest pain and palpitations.  Gastrointestinal: Negative for abdominal pain, constipation and abdominal distention.  Genitourinary: Positive for vaginal discharge.  Musculoskeletal: Negative.   Psychiatric/Behavioral: Negative for behavioral problems and dysphoric mood.    Objective:  BP 135/83 mmHg  Pulse 91  Temp(Src) 98.2 F (36.8 C) (Oral)  Resp 18  Ht '5\' 4"'$  (1.626 m)  Wt 287 lb 9.6 oz (130.455 kg)  BMI 49.34 kg/m2  SpO2 98%  LMP 08/23/2015 (Approximate)  BP/Weight 09/02/2015 7/62/8315 04/01/6158  Systolic BP 737 106 269  Diastolic BP 83 66 71  Wt. (Lbs) 287.6 282.6 277.9  BMI 49.34 48.48 -      Physical Exam  Constitutional: She is oriented to person, place, and time. She appears well-developed and well-nourished.  Cardiovascular: Normal rate, normal heart sounds and intact distal pulses.   No murmur heard. Pulmonary/Chest: Effort normal and breath sounds normal. She has no wheezes. She has no rales. She exhibits no tenderness.  Abdominal: Soft. Bowel sounds are normal. She exhibits no distension and no mass.  There is no tenderness.  Genitourinary:  Yellowish discharge in vagina  Musculoskeletal: Normal range of motion.  Neurological: She is alert and oriented to person, place, and time.    Lab Results  Component Value Date   HGBA1C 9.9* 07/28/2015    Lipid Panel     Component Value Date/Time   CHOL 128 07/28/2015 0315   TRIG 70 07/28/2015 0315   HDL 38* 07/28/2015 0315   CHOLHDL 3.4 07/28/2015 0315   VLDL 14 07/28/2015 0315   LDLCALC 76 07/28/2015 0315      Assessment & Plan:   1. Type 2 diabetes mellitus with hyperglycemia, with long-term current use of insulin (HCC) Uncontrolled with A1c of 9.9 Plan is to switch to Victoza next visit - Glucose (CBG)  2. Vaginal discharge - Cervicovaginal ancillary only  3. HLD (hyperlipidemia) Controlled - atorvastatin (LIPITOR) 20 MG tablet; Take 1 tablet (20 mg total) by mouth daily.  Dispense: 30 tablet; Refill: 3   Meds ordered this encounter  Medications  . atorvastatin (LIPITOR) 20 MG tablet    Sig: Take 1 tablet (20 mg total) by mouth daily.    Dispense:  30 tablet    Refill:  3    Follow-up: Return in about 2 weeks (around 09/16/2015) for follow up on diabetes mellitus.   Arnoldo Morale MD

## 2015-09-05 ENCOUNTER — Telehealth: Payer: Self-pay

## 2015-09-05 ENCOUNTER — Other Ambulatory Visit: Payer: Self-pay | Admitting: Family Medicine

## 2015-09-05 LAB — CERVICOVAGINAL ANCILLARY ONLY
CHLAMYDIA, DNA PROBE: NEGATIVE
NEISSERIA GONORRHEA: NEGATIVE

## 2015-09-05 MED ORDER — METRONIDAZOLE 0.75 % VA GEL: 1.0000 | Freq: Every day | VAGINAL | Status: DC

## 2015-09-05 NOTE — Telephone Encounter (Signed)
LVM for patient requesting her to return writers call to discuss test results.  Also notified her that there is a prescription at [facility] for her to pick up.

## 2015-09-06 LAB — CERVICOVAGINAL ANCILLARY ONLY: WET PREP (BD AFFIRM): NEGATIVE

## 2015-09-09 NOTE — Telephone Encounter (Signed)
Pt returned call to discuss results

## 2015-09-09 NOTE — Telephone Encounter (Signed)
Called and lvm today and requested patient to call back for test results.

## 2015-09-12 MED FILL — glipiZIDE 10 MG TABS: 10 | 30 days supply | Qty: 60 | Fill #2

## 2015-09-12 MED FILL — metFORMIN HCL 1000 MG TABS: 1000 | 30 days supply | Qty: 60 | Fill #4

## 2015-09-12 MED FILL — LISINOPRIL 5 MG TABLET: 5 | 30 days supply | Qty: 15 | Fill #1

## 2015-09-12 NOTE — Telephone Encounter (Signed)
Writer finally spoke with patient and gave her the results from the vaginal cultures that were completed by Dr. Jarold Song;  Notes Recorded by Arnoldo Morale, MD on 09/05/2015 at 4:10 PM Vaginal cultures are negative for Chlamydia and Gonorrhea. I am sending a prescription for MetroGel to the pharmacy to treat for bacterial vaginosis presumptively.  Patient is now aware of results, stated understanding and will pick up the medication tomorrow.

## 2015-09-12 NOTE — Telephone Encounter (Signed)
Patient called, would like to talk to PCP nurse. Please f/up

## 2015-09-13 ENCOUNTER — Other Ambulatory Visit: Payer: Self-pay | Admitting: Family Medicine

## 2015-09-13 MED ORDER — INSULIN DETEMIR 100 UNIT/ML FLEXPEN: 15.0000 [IU] | PEN_INJECTOR | Freq: Every day | SUBCUTANEOUS | Status: DC

## 2015-09-14 ENCOUNTER — Ambulatory Visit: Payer: Self-pay | Admitting: Family Medicine

## 2015-09-15 MED FILL — !LEVEMIR FLEXPEN 100UNITS/M: 100U/ML (3) | 40 days supply | Qty: 6 | Fill #0

## 2015-10-07 ENCOUNTER — Encounter: Payer: Self-pay | Admitting: Family Medicine

## 2015-10-07 ENCOUNTER — Ambulatory Visit: Payer: BC Managed Care – PPO | Attending: Family Medicine | Admitting: Family Medicine

## 2015-10-07 VITALS — BP 142/86 | HR 104 | Temp 98.4°F | Ht 64.0 in | Wt 284.4 lb

## 2015-10-07 DIAGNOSIS — E1165 Type 2 diabetes mellitus with hyperglycemia: Secondary | ICD-10-CM | POA: Diagnosis not present

## 2015-10-07 DIAGNOSIS — Z794 Long term (current) use of insulin: Secondary | ICD-10-CM | POA: Diagnosis not present

## 2015-10-07 LAB — GLUCOSE, POCT (MANUAL RESULT ENTRY): POC GLUCOSE: 131 mg/dL — AB (ref 70–99)

## 2015-10-07 LAB — POCT GLYCOSYLATED HEMOGLOBIN (HGB A1C): Hemoglobin A1C: 8.3

## 2015-10-07 MED ORDER — LIRAGLUTIDE 18 MG/3ML ~~LOC~~ SOPN: PEN_INJECTOR | SUBCUTANEOUS | Status: DC

## 2015-10-07 MED FILL — VICTOZA 2-PAK 18 MG/3 ML PE: 18 | 30 days supply | Qty: 6 | Fill #0

## 2015-10-07 MED FILL — LISINOPRIL 5 MG TABLET: 5 | 30 days supply | Qty: 15 | Fill #2

## 2015-10-07 MED FILL — metFORMIN HCL 1000 MG TABS: 1000 | 30 days supply | Qty: 60 | Fill #5

## 2015-10-07 MED FILL — ATORVASTATIN 20 MG TABLET: 20 | 30 days supply | Qty: 30 | Fill #1

## 2015-10-07 NOTE — Progress Notes (Signed)
Subjective:  Patient ID: Meghan Finley, female    DOB: Jun 29, 1970  Age: 45 y.o. MRN: [id number]  CC: No chief complaint on file.   HPI Meghan Finley is a 45 year old female with a history of type 2 diabetes mellitus (A1c 8.3), hypertension, hyperlipidemia, Anxiety who comes into the clinic for follow-up of diabetes mellitus Her fasting sugars have been in the 114-249 range and she has been on glipizide, metformin and Levemir 15 units at bedtime. She has not been compliant with a diabetic diet due to her limited income.   Does have some postnasal drip and early morning cough with production of whitish sputum. She suffers from seasonal allergies and uses her Flonase as needed rather than daily due to financial constraints.   Outpatient Prescriptions Prior to Visit  Medication Sig Dispense Refill  . atorvastatin (LIPITOR) 20 MG tablet Take 1 tablet (20 mg total) by mouth daily. 30 tablet 3  . Blood Glucose Monitoring Suppl (ONE TOUCH ULTRA SYSTEM KIT) W/DEVICE KIT 1 kit by Does not apply route once. 1 each 0  . Cholecalciferol (VITAMIN D-3) 1000 units CAPS Take 2 capsules by mouth daily.    . clonazePAM (KLONOPIN) 1 MG tablet Take 1 mg by mouth 2 (two) times daily as needed for anxiety.    . ferrous sulfate 325 (65 FE) MG tablet TAKE 1 TABLET (325 MG TOTAL) BY MOUTH DAILY WITH BREAKFAST. 90 tablet 3  . fluticasone (FLONASE) 50 MCG/ACT nasal spray Place 1 spray into both nostrils daily.    . Insulin Detemir (LEVEMIR FLEXTOUCH) 100 UNIT/ML Pen Inject 15 Units into the skin daily at 10 pm. 2 pen 1  . lisinopril (PRINIVIL,ZESTRIL) 5 MG tablet Take 0.5 tablets (2.5 mg total) by mouth daily. 15 tablet 2  . meloxicam (MOBIC) 7.5 MG tablet Take 1 tablet (7.5 mg total) by mouth daily. 30 tablet 0  . metFORMIN (GLUCOPHAGE) 1000 MG tablet TAKE 1 TABLET BY MOUTH 2 TIMES DAILY WITH A MEAL. 60 tablet 4  . metroNIDAZOLE (METROGEL VAGINAL) 0.75 % vaginal gel Place 1 Applicatorful vaginally at bedtime. 70  g 0  . ONETOUCH DELICA LANCETS 44W MISC 1 each by Does not apply route 4 (four) times daily -  before meals and at bedtime. 100 each 11  . Probiotic Product (PROBIOTIC + OMEGA-3 PO) Take 1 tablet by mouth daily. Reported on 10/07/2015    . traMADol (ULTRAM) 50 MG tablet Take 1 tablet (50 mg total) by mouth every 6 (six) hours as needed. For pain 10 tablet 0  . glipiZIDE (GLUCOTROL) 10 MG tablet Take 1 tablet (10 mg total) by mouth 2 (two) times daily before a meal. 60 tablet 2  . glucose blood (ONE TOUCH ULTRA TEST) test strip Use three times daily, before meals, and at bedtime 100 each 12  . Olopatadine HCl (PATADAY) 0.2 % SOLN Apply 1 drop to eye daily. Left eye (Patient not taking: Reported on 10/07/2015) 2.5 mL 0  . omega-3 acid ethyl esters (LOVAZA) 1 g capsule Take 2 g by mouth daily. Reported on 10/07/2015     No facility-administered medications prior to visit.    ROS Review of Systems  Constitutional: Negative for activity change and appetite change.  HENT:       See hpi  Respiratory: Negative for chest tightness, shortness of breath and wheezing.   Cardiovascular: Negative for chest pain and palpitations.  Gastrointestinal: Negative for abdominal pain, constipation and abdominal distention.  Genitourinary: Negative.   Musculoskeletal: Negative.  Psychiatric/Behavioral: Negative for behavioral problems and dysphoric mood.    Objective:  BP 142/86 mmHg  Pulse 104  Temp(Src) 98.4 F (36.9 C)  Ht '5\' 4"'$  (1.626 m)  Wt 284 lb 6.4 oz (129.003 kg)  BMI 48.79 kg/m2  SpO2 98%  BP/Weight 10/07/2015 09/02/2015 08/03/209  Systolic BP 173 567 014  Diastolic BP 86 83 66  Wt. (Lbs) 284.4 287.6 282.6  BMI 48.79 49.34 48.48      Physical Exam  Constitutional: She is oriented to person, place, and time. She appears well-developed and well-nourished.  Cardiovascular: Normal rate, normal heart sounds and intact distal pulses.   No murmur heard. Pulmonary/Chest: Effort normal and breath  sounds normal. She has no wheezes. She has no rales. She exhibits no tenderness.  Abdominal: Soft. Bowel sounds are normal. She exhibits no distension and no mass. There is no tenderness.  Musculoskeletal: Normal range of motion.  Neurological: She is alert and oriented to person, place, and time.     Assessment & Plan:   1. Type 2 diabetes mellitus with hyperglycemia, with long-term current use of insulin (HCC) A1c is 8.3 down from 9.9, two months ago. Commenced Victoza which will also help with weight loss and she has been instructed on titration and the goal is to discontinue Levemir at her next visit. - POCT glycosylated hemoglobin (Hb A1C) - POCT glucose (manual entry) - Liraglutide (VICTOZA) 18 MG/3ML SOPN; Inject subcutaneously daily, 0.6 mg for1 week, then 1.2 mg for1 week then continue 1.8 mg daily.  Dispense: 6 mL; Refill: 3   Meds ordered this encounter  Medications  . Liraglutide (VICTOZA) 18 MG/3ML SOPN    Sig: Inject subcutaneously daily, 0.6 mg for1 week, then 1.2 mg for1 week then continue 1.8 mg daily.    Dispense:  6 mL    Refill:  3    Follow-up: Return in about 3 weeks (around 10/28/2015) for Follow-up on diabetes mellitus.   Arnoldo Morale MD

## 2015-10-07 NOTE — Patient Instructions (Signed)
Diabetes Mellitus and Food It is important for you to manage your blood sugar (glucose) level. Your blood glucose level can be greatly affected by what you eat. Eating healthier foods in the appropriate amounts throughout the day at about the same time each day will help you control your blood glucose level. It can also help slow or prevent worsening of your diabetes mellitus. Healthy eating may even help you improve the level of your blood pressure and reach or maintain a healthy weight.  General recommendations for healthful eating and cooking habits include:  Eating meals and snacks regularly. Avoid going long periods of time without eating to lose weight.  Eating a diet that consists mainly of plant-based foods, such as fruits, vegetables, nuts, legumes, and whole grains.  Using low-heat cooking methods, such as baking, instead of high-heat cooking methods, such as deep frying. Work with your dietitian to make sure you understand how to use the Nutrition Facts information on food labels. HOW CAN FOOD AFFECT ME? Carbohydrates Carbohydrates affect your blood glucose level more than any other type of food. Your dietitian will help you determine how many carbohydrates to eat at each meal and teach you how to count carbohydrates. Counting carbohydrates is important to keep your blood glucose at a healthy level, especially if you are using insulin or taking certain medicines for diabetes mellitus. Alcohol Alcohol can cause sudden decreases in blood glucose (hypoglycemia), especially if you use insulin or take certain medicines for diabetes mellitus. Hypoglycemia can be a life-threatening condition. Symptoms of hypoglycemia (sleepiness, dizziness, and disorientation) are similar to symptoms of having too much alcohol.  If your health care provider has given you approval to drink alcohol, do so in moderation and use the following guidelines:  Women should not have more than one drink per day, and men  should not have more than two drinks per day. One drink is equal to:  12 oz of beer.  5 oz of wine.  1 oz of hard liquor.  Do not drink on an empty stomach.  Keep yourself hydrated. Have water, diet soda, or unsweetened iced tea.  Regular soda, juice, and other mixers might contain a lot of carbohydrates and should be counted. WHAT FOODS ARE NOT RECOMMENDED? As you make food choices, it is important to remember that all foods are not the same. Some foods have fewer nutrients per serving than other foods, even though they might have the same number of calories or carbohydrates. It is difficult to get your body what it needs when you eat foods with fewer nutrients. Examples of foods that you should avoid that are high in calories and carbohydrates but low in nutrients include:  Trans fats (most processed foods list trans fats on the Nutrition Facts label).  Regular soda.  Juice.  Candy.  Sweets, such as cake, pie, doughnuts, and cookies.  Fried foods. WHAT FOODS CAN I EAT? Eat nutrient-rich foods, which will nourish your body and keep you healthy. The food you should eat also will depend on several factors, including:  The calories you need.  The medicines you take.  Your weight.  Your blood glucose level.  Your blood pressure level.  Your cholesterol level. You should eat a variety of foods, including:  Protein.  Lean cuts of meat.  Proteins low in saturated fats, such as fish, egg whites, and beans. Avoid processed meats.  Fruits and vegetables.  Fruits and vegetables that may help control blood glucose levels, such as apples, mangoes, and   yams.  Dairy products.  Choose fat-free or low-fat dairy products, such as milk, yogurt, and cheese.  Grains, bread, pasta, and rice.  Choose whole grain products, such as multigrain bread, whole oats, and brown rice. These foods may help control blood pressure.  Fats.  Foods containing healthful fats, such as nuts,  avocado, olive oil, canola oil, and fish. DOES EVERYONE WITH DIABETES MELLITUS HAVE THE SAME MEAL PLAN? Because every person with diabetes mellitus is different, there is not one meal plan that works for everyone. It is very important that you meet with a dietitian who will help you create a meal plan that is just right for you.   This information is not intended to replace advice given to you by your health care provider. Make sure you discuss any questions you have with your health care provider.   Document Released: 12/07/2004 Document Revised: 04/02/2014 Document Reviewed: 02/06/2013 Elsevier Interactive Patient Education 2016 [employer]  

## 2015-10-12 ENCOUNTER — Telehealth: Payer: Self-pay | Admitting: Family Medicine

## 2015-10-12 NOTE — Telephone Encounter (Signed)
Questions re: medication dosage.

## 2015-10-12 NOTE — Telephone Encounter (Signed)
Pt calling stating her blood sugar is at 82 currently Pt is taking 0.6 units of Victoza and 10 units of insulin and is not sure if this is the correct units she should be taking Please assist, thank you

## 2015-10-13 NOTE — Telephone Encounter (Signed)
I spoke to the patient myself on the phone and advised her to continue Levemir 10 units and 0.6 of Victoza and next week reduce Levemir to 5 units while increasing Victoza to 1.2 in the event that blood sugars are in the hypoglycemic range she is to discontinue Levemir altogether.

## 2015-10-17 ENCOUNTER — Other Ambulatory Visit: Payer: Self-pay | Admitting: Internal Medicine

## 2015-10-17 DIAGNOSIS — Z794 Long term (current) use of insulin: Principal | ICD-10-CM

## 2015-10-17 DIAGNOSIS — E119 Type 2 diabetes mellitus without complications: Secondary | ICD-10-CM

## 2015-10-17 MED FILL — clonazePAM 1 MG TABS: 1 | 30 days supply | Qty: 60 | Fill #1

## 2015-10-17 NOTE — Telephone Encounter (Signed)
Rx request 

## 2015-10-18 MED FILL — glipiZIDE 10 MG TABS: 10 | 30 days supply | Qty: 60 | Fill #0

## 2015-10-18 MED FILL — ONE TOUCH ULTRA TEST STRIPS: 30 days supply | Qty: 100 | Fill #1

## 2015-11-02 ENCOUNTER — Other Ambulatory Visit: Payer: Self-pay | Admitting: Family Medicine

## 2015-11-02 MED FILL — ATORVASTATIN 20 MG TABLET: 20 | 30 days supply | Qty: 30 | Fill #2

## 2015-11-02 MED FILL — MELOXICAM 7.5 MG TABLET: 7.5 | 30 days supply | Qty: 30 | Fill #0

## 2015-11-02 MED FILL — VICTOZA 2-PAK 18 MG/3 ML PE: 18 | 30 days supply | Qty: 9 | Fill #1

## 2015-11-04 ENCOUNTER — Ambulatory Visit: Payer: BC Managed Care – PPO | Attending: Family Medicine | Admitting: Family Medicine

## 2015-11-04 ENCOUNTER — Other Ambulatory Visit: Payer: Self-pay | Admitting: Family Medicine

## 2015-11-04 ENCOUNTER — Encounter: Payer: Self-pay | Admitting: Family Medicine

## 2015-11-04 VITALS — BP 130/80 | HR 100 | Temp 97.7°F | Ht 64.0 in | Wt 272.8 lb

## 2015-11-04 DIAGNOSIS — I10 Essential (primary) hypertension: Secondary | ICD-10-CM | POA: Diagnosis not present

## 2015-11-04 DIAGNOSIS — Z794 Long term (current) use of insulin: Secondary | ICD-10-CM

## 2015-11-04 DIAGNOSIS — E785 Hyperlipidemia, unspecified: Secondary | ICD-10-CM

## 2015-11-04 DIAGNOSIS — E119 Type 2 diabetes mellitus without complications: Secondary | ICD-10-CM

## 2015-11-04 DIAGNOSIS — E1165 Type 2 diabetes mellitus with hyperglycemia: Secondary | ICD-10-CM

## 2015-11-04 LAB — POCT GLYCOSYLATED HEMOGLOBIN (HGB A1C): Hemoglobin A1C: 7.4

## 2015-11-04 LAB — GLUCOSE, POCT (MANUAL RESULT ENTRY): POC Glucose: 133 mg/dl — AB (ref 70–99)

## 2015-11-04 MED ORDER — GLIPIZIDE 10 MG PO TABS: ORAL_TABLET | ORAL | 3 refills | Status: DC

## 2015-11-04 MED ORDER — MELOXICAM 7.5 MG PO TABS: 7.5000 mg | ORAL_TABLET | Freq: Every day | ORAL | 3 refills | Status: DC

## 2015-11-04 MED ORDER — ATORVASTATIN CALCIUM 20 MG PO TABS: 20.0000 mg | ORAL_TABLET | Freq: Every day | ORAL | 3 refills | Status: DC

## 2015-11-04 MED ORDER — LISINOPRIL 5 MG PO TABS: 2.5000 mg | ORAL_TABLET | Freq: Every day | ORAL | 3 refills | Status: DC

## 2015-11-04 MED ORDER — LIRAGLUTIDE 18 MG/3ML ~~LOC~~ SOPN: PEN_INJECTOR | SUBCUTANEOUS | 3 refills | Status: DC

## 2015-11-04 MED ORDER — METFORMIN HCL 500 MG PO TABS: ORAL_TABLET | ORAL | 3 refills | Status: DC

## 2015-11-04 MED FILL — metFORMIN HCL 500 MG TABS: 500 | 30 days supply | Qty: 150 | Fill #0

## 2015-11-04 MED FILL — LISINOPRIL 5 MG TABLET: 5 | 30 days supply | Qty: 15 | Fill #0

## 2015-11-04 NOTE — Progress Notes (Signed)
Subjective:  Patient ID: Meghan Finley, female    DOB: [birth year]  Age: 45 y.o. MRN: [id number]  CC: Diabetes; Hypertension; and Hyperlipidemia   HPI Meghan Finley is a 45 year old female with a history of type 2 diabetes mellitus (A1c 7.4) hypertension, hyperlipidemia, anxiety commenced on Victoza at her last office visit and has been tapered off Levemir. She is currently on the maximum dose of Victoza and reports fasting sugars of 120 to 183 but when she was on 5 units of Levemir along with Victoza sugars ranging the 100-120 range. If she had her way she would like to be off Levemir. She is also working on eating disciplined with an ADA diet  but has not been exercising She has lost 12 pounds since her last visit a month ago. She has no complaints today.  Anxiety is managed by Psych.  Past Surgical History:  Procedure Laterality Date  . TOOTH EXTRACTION        . Outpatient Medications Prior to Visit  Medication Sig Dispense Refill  . Blood Glucose Monitoring Suppl (ONE TOUCH ULTRA SYSTEM KIT) W/DEVICE KIT 1 kit by Does not apply route once. 1 each 0  . clonazePAM (KLONOPIN) 1 MG tablet Take 1 mg by mouth 2 (two) times daily as needed for anxiety.    . ferrous sulfate 325 (65 FE) MG tablet TAKE 1 TABLET (325 MG TOTAL) BY MOUTH DAILY WITH BREAKFAST. 90 tablet 3  . fluticasone (FLONASE) 50 MCG/ACT nasal spray Place 1 spray into both nostrils daily.    Marland Kitchen glucose blood (ONE TOUCH ULTRA TEST) test strip Use three times daily, before meals, and at bedtime 100 each 12  . Liraglutide (VICTOZA) 18 MG/3ML SOPN Inject subcutaneously daily, 0.6 mg for1 week, then 1.2 mg for1 week then continue 1.8 mg daily. 6 mL 3  . omega-3 acid ethyl esters (LOVAZA) 1 g capsule Take 2 g by mouth daily. Reported on 10/07/2015    . ONETOUCH DELICA LANCETS 51W MISC 1 each by Does not apply route 4 (four) times daily -  before meals and at bedtime. 100 each 11  . Probiotic Product (PROBIOTIC + OMEGA-3 PO) Take  1 tablet by mouth daily. Reported on 10/07/2015    . traMADol (ULTRAM) 50 MG tablet Take 1 tablet (50 mg total) by mouth every 6 (six) hours as needed. For pain 10 tablet 0  . atorvastatin (LIPITOR) 20 MG tablet Take 1 tablet (20 mg total) by mouth daily. 30 tablet 3  . glipiZIDE (GLUCOTROL) 10 MG tablet TAKE 1 TABLET BY MOUTH 2 TIMES DAILY BEFORE A MEAL. 60 tablet 2  . Insulin Detemir (LEVEMIR FLEXTOUCH) 100 UNIT/ML Pen Inject 15 Units into the skin daily at 10 pm. 2 pen 1  . lisinopril (PRINIVIL,ZESTRIL) 5 MG tablet Take 0.5 tablets (2.5 mg total) by mouth daily. 15 tablet 2  . meloxicam (MOBIC) 7.5 MG tablet TAKE 1 TABLET BY MOUTH DAILY. 30 tablet 0  . metFORMIN (GLUCOPHAGE) 1000 MG tablet TAKE 1 TABLET BY MOUTH 2 TIMES DAILY WITH A MEAL. 60 tablet 4  . Cholecalciferol (VITAMIN D-3) 1000 units CAPS Take 2 capsules by mouth daily.    . metroNIDAZOLE (METROGEL VAGINAL) 0.75 % vaginal gel Place 1 Applicatorful vaginally at bedtime. (Patient not taking: Reported on 11/04/2015) 70 g 0  . Olopatadine HCl (PATADAY) 0.2 % SOLN Apply 1 drop to eye daily. Left eye (Patient not taking: Reported on 10/07/2015) 2.5 mL 0   No facility-administered medications prior to visit.  ROS Review of Systems  Constitutional: Negative for activity change and appetite change.  HENT: Negative for sinus pressure and sore throat.   Respiratory: Negative for chest tightness, shortness of breath and wheezing.   Cardiovascular: Negative for chest pain and palpitations.  Gastrointestinal: Negative for abdominal distention, abdominal pain and constipation.  Genitourinary: Negative.   Musculoskeletal: Negative.   Psychiatric/Behavioral: Negative for behavioral problems and dysphoric mood.    Objective:  BP 130/80 (BP Location: Left Arm, Patient Position: Sitting, Cuff Size: Large)   Pulse 100   Temp 97.7 F (36.5 C) (Oral)   Ht 5\' 4"  (1.626 m)   Wt 272 lb 12.8 oz (123.7 kg)   SpO2 99%   BMI 46.83 kg/m    BP/Weight 11/04/2015 10/07/2015 09/02/2015  Systolic BP 130 142 135  Diastolic BP 80 86 83  Wt. (Lbs) 272.8 284.4 287.6  BMI 46.83 48.79 49.34      Physical Exam  Constitutional: She is oriented to person, place, and time. She appears well-developed and well-nourished.  Obese  Cardiovascular: Normal rate, normal heart sounds and intact distal pulses.   No murmur heard. Pulmonary/Chest: Effort normal and breath sounds normal. She has no wheezes. She has no rales. She exhibits no tenderness.  Abdominal: Soft. Bowel sounds are normal. She exhibits no distension and no mass. There is no tenderness.  Musculoskeletal: Normal range of motion.  Neurological: She is alert and oriented to person, place, and time.      Lab Results  Component Value Date   HGBA1C 7.4 11/04/2015   Lipid Panel     Component Value Date/Time   CHOL 128 07/28/2015 0315   TRIG 70 07/28/2015 0315   HDL 38 (L) 07/28/2015 0315   CHOLHDL 3.4 07/28/2015 0315   VLDL 14 07/28/2015 0315   LDLCALC 76 07/28/2015 0315    Assessment & Plan:   1. Type 2 diabetes mellitus not at goal [facility]) A1c 7.4 Significant improvement Continue Victoza, glipizide Increase to maximum dose of metformin from 2000 mg daily to 2500 daily Will review blood sugar log at next visit - Glucose (CBG) - HgB A1c  2. HLD (hyperlipidemia) Controlled - atorvastatin (LIPITOR) 20 MG tablet; Take 1 tablet (20 mg total) by mouth daily.  Dispense: 30 tablet; Refill: 3  3. Type 2 diabetes mellitus without complication, with long-term current use of insulin (HCC) - glipiZIDE (GLUCOTROL) 10 MG tablet; TAKE 1 TABLET BY MOUTH 2 TIMES DAILY BEFORE A MEAL.  Dispense: 60 tablet; Refill: 3 - metFORMIN (GLUCOPHAGE) 500 MG tablet; Take orally 3 tabs (1500mg ) in the morning and 2 tabs (1000mg ) in the evening  Dispense: 150 tablet; Refill: 3  4. Essential hypertension Controlled - lisinopril (PRINIVIL,ZESTRIL) 5 MG tablet; Take 0.5 tablets (2.5 mg total)  by mouth daily.  Dispense: 15 tablet; Refill: 3   Meds ordered this encounter  Medications  . atorvastatin (LIPITOR) 20 MG tablet    Sig: Take 1 tablet (20 mg total) by mouth daily.    Dispense:  30 tablet    Refill:  3  . glipiZIDE (GLUCOTROL) 10 MG tablet    Sig: TAKE 1 TABLET BY MOUTH 2 TIMES DAILY BEFORE A MEAL.    Dispense:  60 tablet    Refill:  3  . meloxicam (MOBIC) 7.5 MG tablet    Sig: Take 1 tablet (7.5 mg total) by mouth daily.    Dispense:  30 tablet    Refill:  3  . lisinopril (PRINIVIL,ZESTRIL) 5 MG tablet  Sig: Take 0.5 tablets (2.5 mg total) by mouth daily.    Dispense:  15 tablet    Refill:  3  . metFORMIN (GLUCOPHAGE) 500 MG tablet    Sig: Take orally 3 tabs ('1500mg'$ ) in the morning and 2 tabs ('1000mg'$ ) in the evening    Dispense:  150 tablet    Refill:  3    Discontinue previous dose    Follow-up: Return in about 1 month (around 12/05/2015) for Follow-up on diabetes mellitus.   Arnoldo Morale MD

## 2015-11-14 MED FILL — clonazePAM 1 MG TABS: 1 | 30 days supply | Qty: 60 | Fill #2

## 2015-11-18 MED FILL — glipiZIDE 10 MG TABS: 10 | 30 days supply | Qty: 60 | Fill #1

## 2015-12-06 MED FILL — LISINOPRIL 5 MG TABLET: 5 | 30 days supply | Qty: 15 | Fill #1

## 2015-12-06 MED FILL — ATORVASTATIN 20 MG TABLET: 20 | 30 days supply | Qty: 30 | Fill #3

## 2015-12-21 ENCOUNTER — Encounter: Payer: Self-pay | Admitting: Family Medicine

## 2015-12-21 ENCOUNTER — Ambulatory Visit: Payer: BC Managed Care – PPO | Attending: Family Medicine | Admitting: Family Medicine

## 2015-12-21 VITALS — BP 101/67 | HR 92 | Temp 97.8°F | Resp 17 | Ht 64.0 in | Wt 273.4 lb

## 2015-12-21 DIAGNOSIS — E119 Type 2 diabetes mellitus without complications: Secondary | ICD-10-CM | POA: Insufficient documentation

## 2015-12-21 DIAGNOSIS — F419 Anxiety disorder, unspecified: Secondary | ICD-10-CM | POA: Diagnosis not present

## 2015-12-21 DIAGNOSIS — Z7984 Long term (current) use of oral hypoglycemic drugs: Secondary | ICD-10-CM | POA: Diagnosis not present

## 2015-12-21 DIAGNOSIS — G4700 Insomnia, unspecified: Secondary | ICD-10-CM | POA: Diagnosis not present

## 2015-12-21 DIAGNOSIS — J3081 Allergic rhinitis due to animal (cat) (dog) hair and dander: Secondary | ICD-10-CM | POA: Diagnosis not present

## 2015-12-21 DIAGNOSIS — Z6841 Body Mass Index (BMI) 40.0 and over, adult: Secondary | ICD-10-CM | POA: Insufficient documentation

## 2015-12-21 DIAGNOSIS — I10 Essential (primary) hypertension: Secondary | ICD-10-CM | POA: Insufficient documentation

## 2015-12-21 DIAGNOSIS — E1169 Type 2 diabetes mellitus with other specified complication: Secondary | ICD-10-CM

## 2015-12-21 DIAGNOSIS — Z79899 Other long term (current) drug therapy: Secondary | ICD-10-CM | POA: Insufficient documentation

## 2015-12-21 DIAGNOSIS — E785 Hyperlipidemia, unspecified: Secondary | ICD-10-CM | POA: Diagnosis not present

## 2015-12-21 DIAGNOSIS — F411 Generalized anxiety disorder: Secondary | ICD-10-CM

## 2015-12-21 DIAGNOSIS — E669 Obesity, unspecified: Secondary | ICD-10-CM

## 2015-12-21 DIAGNOSIS — J0120 Acute ethmoidal sinusitis, unspecified: Secondary | ICD-10-CM | POA: Diagnosis not present

## 2015-12-21 MED ORDER — METHYLPREDNISOLONE SODIUM SUCC 125 MG IJ SOLR
62.5000 mg | Freq: Once | INTRAMUSCULAR | Status: AC
  Administered 2015-12-21: 62.5 mg via INTRAMUSCULAR

## 2015-12-21 MED ORDER — AMOXICILLIN 500 MG PO CAPS: 500.0000 mg | ORAL_CAPSULE | Freq: Three times a day (TID) | ORAL | 0 refills | Status: DC

## 2015-12-21 MED ORDER — FEXOFENADINE HCL 60 MG PO TABS: 60.0000 mg | ORAL_TABLET | Freq: Two times a day (BID) | ORAL | 1 refills | Status: DC

## 2015-12-21 MED FILL — AMOXICILLIN 500 MG CAPSULE: 500 | 30 days supply | Qty: 30 | Fill #0

## 2015-12-21 NOTE — Progress Notes (Signed)
Subjective:  Patient ID: Meghan Finley, female    DOB: [birth year]  Age: 45 y.o. MRN: [id number]  CC: follow up  HPI [facility] with a history of type 2 diabetes mellitus (A1c 7.4) hypertension, hyperlipidemia, anxiety,Obesity who comes in today for a follow-up visit.  Since her last office visit 1 month ago her weight seems to have plateaued. She endorses not being as active as she previously was and sometimes does not "cheats" with regards to her meals. She has been compliant with metformin and Victoza.  Complains of insomnia which was exacerbated by her anxiety over her grades being due at school as she works as a Radio producer and symptoms have persisted even though she has turned in her grades and she finds herself waking up at 4 AM even on the weekends.  She complains of sinus pain, postnasal drip but no fever. Has not been using her Flonase consistently due to cost.  Past Medical History:  Diagnosis Date  . Anemia   . Anxiety   . Depression   . Diabetes mellitus   . Hyperlipidemia   . Hypertension   . Morbid obesity ([place])     Past Surgical History:  Procedure Laterality Date  . TOOTH EXTRACTION       Outpatient Medications Prior to Visit  Medication Sig Dispense Refill  . atorvastatin (LIPITOR) 20 MG tablet Take 1 tablet (20 mg total) by mouth daily. 30 tablet 3  . Blood Glucose Monitoring Suppl (ONE TOUCH ULTRA SYSTEM KIT) W/DEVICE KIT 1 kit by Does not apply route once. 1 each 0  . Cholecalciferol (VITAMIN D-3) 1000 units CAPS Take 2 capsules by mouth daily.    . clonazePAM (KLONOPIN) 1 MG tablet Take 1 mg by mouth 2 (two) times daily as needed for anxiety.    . ferrous sulfate 325 (65 FE) MG tablet TAKE 1 TABLET (325 MG TOTAL) BY MOUTH DAILY WITH BREAKFAST. 90 tablet 3  . fluticasone (FLONASE) 50 MCG/ACT nasal spray Place 1 spray into both nostrils daily.    Marland Kitchen glipiZIDE (GLUCOTROL) 10 MG tablet TAKE 1 TABLET BY MOUTH 2 TIMES DAILY BEFORE A MEAL. 60 tablet 3  .  glucose blood (ONE TOUCH ULTRA TEST) test strip Use three times daily, before meals, and at bedtime 100 each 12  . Liraglutide (VICTOZA) 18 MG/3ML SOPN Inject subcutaneously 1.8 mg daily. 3 pen 3  . lisinopril (PRINIVIL,ZESTRIL) 5 MG tablet Take 0.5 tablets (2.5 mg total) by mouth daily. 15 tablet 3  . meloxicam (MOBIC) 7.5 MG tablet Take 1 tablet (7.5 mg total) by mouth daily. 30 tablet 3  . metFORMIN (GLUCOPHAGE) 500 MG tablet Take orally 3 tabs ('1500mg'$ ) in the morning and 2 tabs ('1000mg'$ ) in the evening 150 tablet 3  . omega-3 acid ethyl esters (LOVAZA) 1 g capsule Take 2 g by mouth daily. Reported on 10/07/2015    . ONETOUCH DELICA LANCETS 42H MISC 1 each by Does not apply route 4 (four) times daily -  before meals and at bedtime. 100 each 11  . Probiotic Product (PROBIOTIC + OMEGA-3 PO) Take 1 tablet by mouth daily. Reported on 10/07/2015    . traMADol (ULTRAM) 50 MG tablet Take 1 tablet (50 mg total) by mouth every 6 (six) hours as needed. For pain 10 tablet 0  . metroNIDAZOLE (METROGEL VAGINAL) 0.75 % vaginal gel Place 1 Applicatorful vaginally at bedtime. (Patient not taking: Reported on 12/21/2015) 70 g 0  . Olopatadine HCl (PATADAY) 0.2 % SOLN Apply  1 drop to eye daily. Left eye (Patient not taking: Reported on 12/21/2015) 2.5 mL 0   No facility-administered medications prior to visit.     ROS Review of Systems  Constitutional: Negative for activity change, appetite change and fatigue.  HENT: Positive for postnasal drip and sinus pressure. Negative for congestion and sore throat.   Eyes: Negative for visual disturbance.  Respiratory: Negative for cough, chest tightness, shortness of breath and wheezing.   Cardiovascular: Negative for chest pain and palpitations.  Gastrointestinal: Negative for abdominal distention, abdominal pain and constipation.  Endocrine: Negative for polydipsia.  Genitourinary: Negative for dysuria and frequency.  Musculoskeletal: Negative for arthralgias and back  pain.  Skin: Negative for rash.  Neurological: Negative for tremors, light-headedness and numbness.  Hematological: Does not bruise/bleed easily.       Positive for anxiety  Psychiatric/Behavioral: Positive for sleep disturbance. Negative for agitation and behavioral problems.    Objective:  BP 101/67 (BP Location: Right Arm, Patient Position: Sitting, Cuff Size: Large)   Pulse 92   Temp 97.8 F (36.6 C) (Oral)   Resp 17   Ht '5\' 4"'$  (1.626 m)   Wt 273 lb 6.4 oz (124 kg)   LMP 12/01/2015 (Approximate)   SpO2 100%   BMI 46.93 kg/m   BP/Weight 12/21/2015 11/04/2015 0/35/0093  Systolic BP 818 299 371  Diastolic BP 67 80 86  Wt. (Lbs) 273.4 272.8 284.4  BMI 46.93 46.83 48.79      Physical Exam  Constitutional: She is oriented to person, place, and time. She appears well-developed and well-nourished.  Cardiovascular: Normal rate, normal heart sounds and intact distal pulses.   No murmur heard. Pulmonary/Chest: Effort normal and breath sounds normal. She has no wheezes. She has no rales. She exhibits no tenderness.  Abdominal: Soft. Bowel sounds are normal. She exhibits no distension and no mass. There is no tenderness.  Musculoskeletal: Normal range of motion.  Neurological: She is alert and oriented to person, place, and time.   Lab Results  Component Value Date   HGBA1C 7.4 11/04/2015      Assessment & Plan:   1. Generalized anxiety disorder Continue Klonopin Discussed some anxiety managing strategies  2. Essential hypertension Controlled  3. Acute ethmoidal sinusitis, recurrence not specified - amoxicillin (AMOXIL) 500 MG capsule; Take 1 capsule (500 mg total) by mouth 3 (three) times daily.  Dispense: 30 capsule; Refill: 0 - methylPREDNISolone sodium succinate (SOLU-MEDROL) 125 mg/2 mL injection 62.5 mg; Inject 1 mL (62.5 mg total) into the muscle once. - fexofenadine (ALLEGRA ALLERGY) 60 MG tablet; Take 1 tablet (60 mg total) by mouth 2 (two) times daily.   Dispense: 60 tablet; Refill: 1  4. Insomnia Advised to take both of her Klonopin tablets at night to help with sleep I have encouraged exercise which will help with insomnia.  5. Allergic rhinitis due to animal hair and dander We'll try Allegra His insurance doesn't cover she might have to use over-the-counter loratadine or Zyrtec  6. Obesity Discussed exercise regimen, reducing portion sizes Weight loss goal is 1-2 pounds per week.  7. Type 2 Diabetes Mellitus Controlled with A1c of 7.4 ADA diet Continue current regimen, exercise Advised to take Levemir 5 units at bedtime due to steroid injection which could cause hyperglycemia  Meds ordered this encounter  Medications  . amoxicillin (AMOXIL) 500 MG capsule    Sig: Take 1 capsule (500 mg total) by mouth 3 (three) times daily.    Dispense:  30 capsule  Refill:  0  . methylPREDNISolone sodium succinate (SOLU-MEDROL) 125 mg/2 mL injection 62.5 mg  . fexofenadine (ALLEGRA ALLERGY) 60 MG tablet    Sig: Take 1 tablet (60 mg total) by mouth 2 (two) times daily.    Dispense:  60 tablet    Refill:  1    Follow-up: Return in about 2 months (around 02/20/2016) for follow up of Diabetes and Obesity.   This note has been created with Surveyor, quantity. Any transcriptional errors are unintentional.    Arnoldo Morale MD

## 2015-12-21 NOTE — Progress Notes (Signed)
Sinus pain for the last 3 days.   Patients states, she is allegeric to cats and trees.   Refill on Flonase Metformin  Dr check Heel on left foot and the inside of knee on right leg. Patient thinks it may be a puncture. Also states knee bone looks like it is protruding.   No flu shot

## 2015-12-23 MED FILL — glipiZIDE 10 MG TABS: 10 | 30 days supply | Qty: 60 | Fill #2

## 2015-12-23 MED FILL — metFORMIN HCL 500 MG TABS: 500 | 30 days supply | Qty: 150 | Fill #1

## 2016-01-12 MED FILL — LISINOPRIL 5 MG TABLET: 5 | 30 days supply | Qty: 15 | Fill #2 | Status: TO

## 2016-01-12 MED FILL — ATORVASTATIN 20 MG TABLET: 20 | 30 days supply | Qty: 30 | Fill #0 | Status: TO

## 2016-01-17 MED FILL — DOXEPIN 25 MG CAPSULE: 25 | 30 days supply | Qty: 60 | Fill #0

## 2016-01-17 MED FILL — clonazePAM 1 MG TABS: 1 | 30 days supply | Qty: 60 | Fill #0

## 2016-01-31 MED FILL — glipiZIDE 10 MG TABS: 10 | 30 days supply | Qty: 60 | Fill #0 | Status: TO

## 2016-01-31 MED FILL — metFORMIN HCL 500 MG TABS: 500 | 30 days supply | Qty: 150 | Fill #2

## 2016-02-06 IMAGING — US US TRANSVAGINAL NON-OB
1 series · 13 of 25 positions shown · non-contrast
Comparison: 12/10/2012

CLINICAL DATA: Abnormal uterine bleeding x years, history of
fibroids

EXAM:
TRANSABDOMINAL AND TRANSVAGINAL ULTRASOUND OF PELVIS
TECHNIQUE: Both transabdominal and transvaginal ultrasound examinations of the
pelvis were performed. Transabdominal technique was performed for
global imaging of the pelvis including uterus, ovaries, adnexal
regions, and pelvic cul-de-sac. It was necessary to proceed with
endovaginal exam following the transabdominal exam to visualize the
endometrium and left adnexa.

[Series 1: us pelvis complete · 13 of 41 slices shown]
[im 1/41]
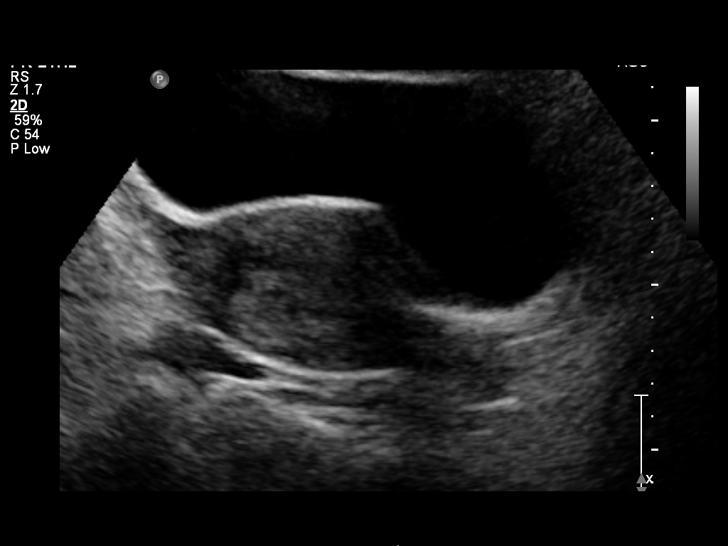
[im 4/41]
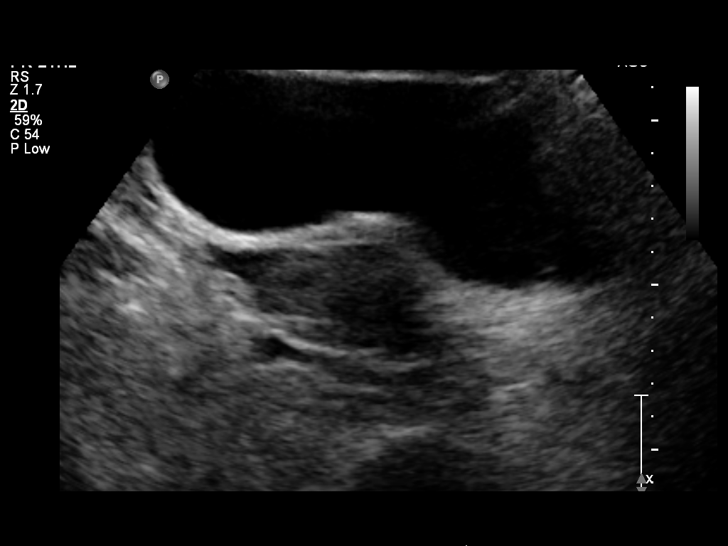
[im 7/41]
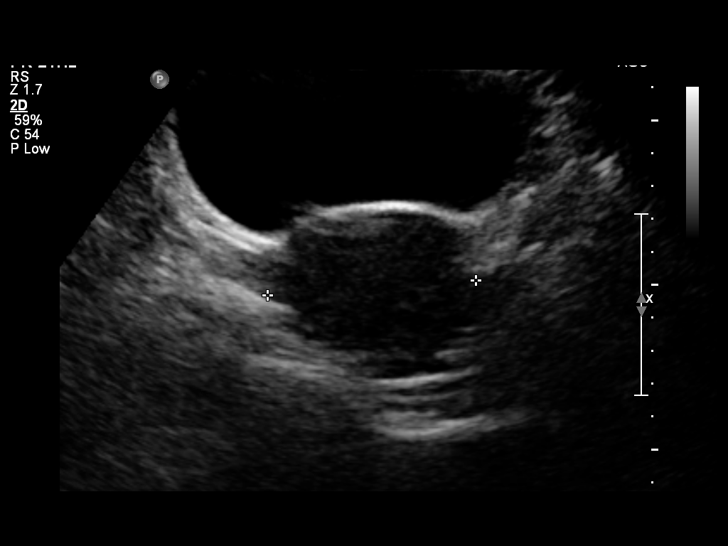
[im 11/41]
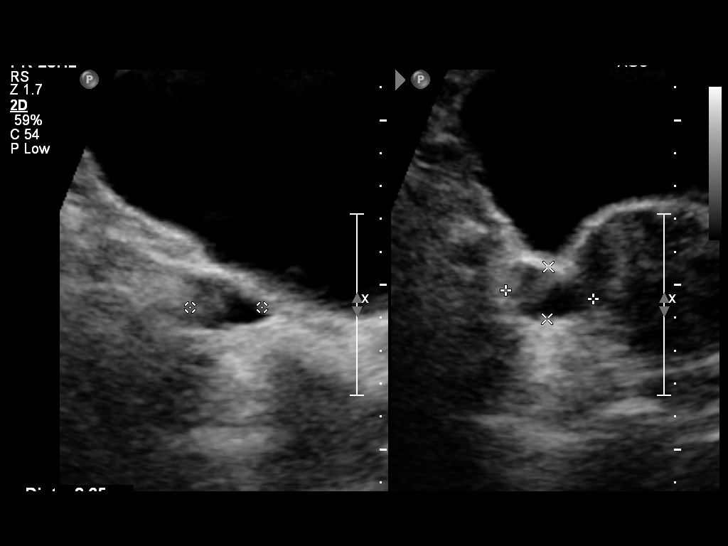
[im 14/41]
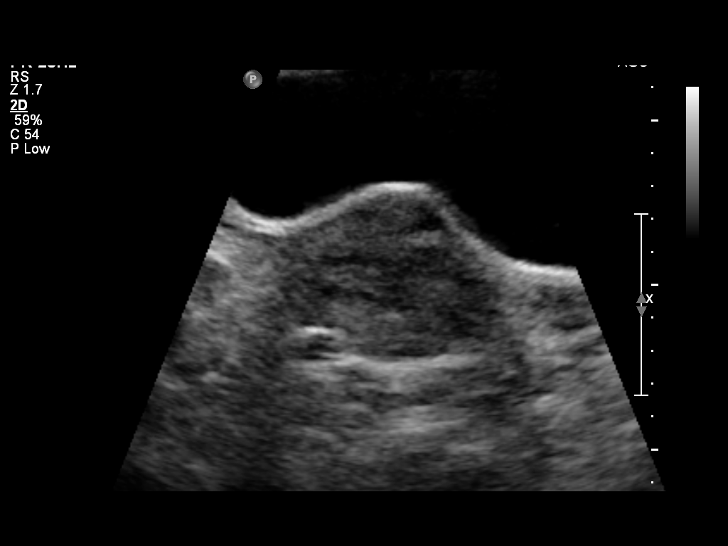
[im 17/41]
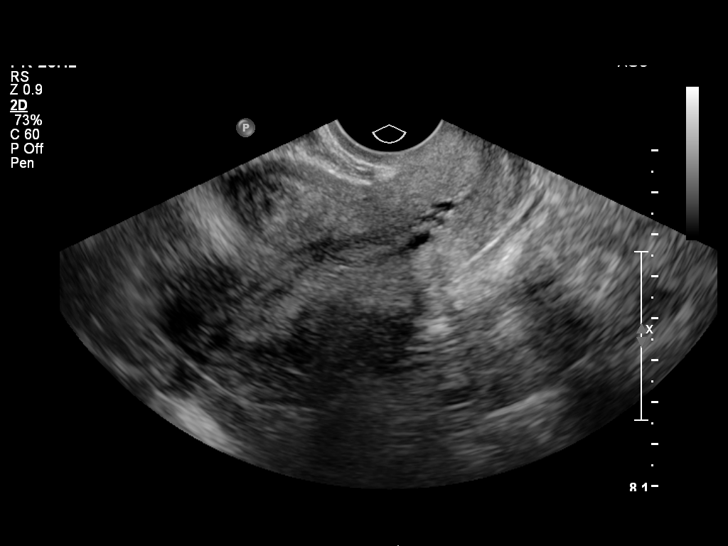
[im 21/41]
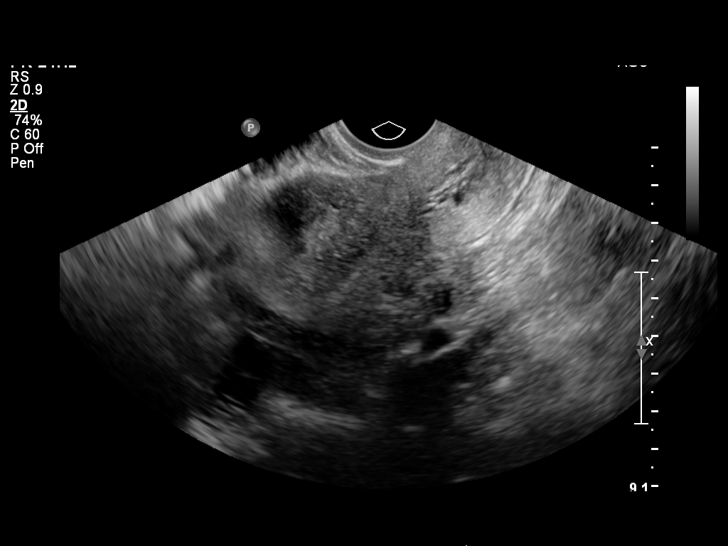
[im 24/41]
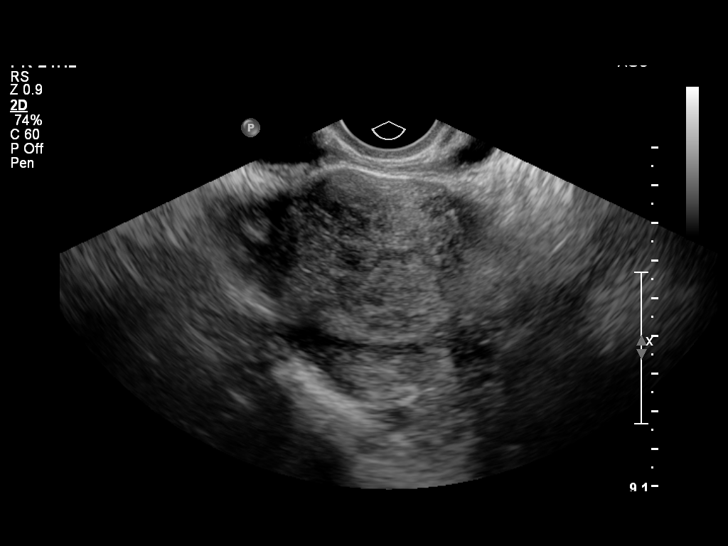
[im 27/41]
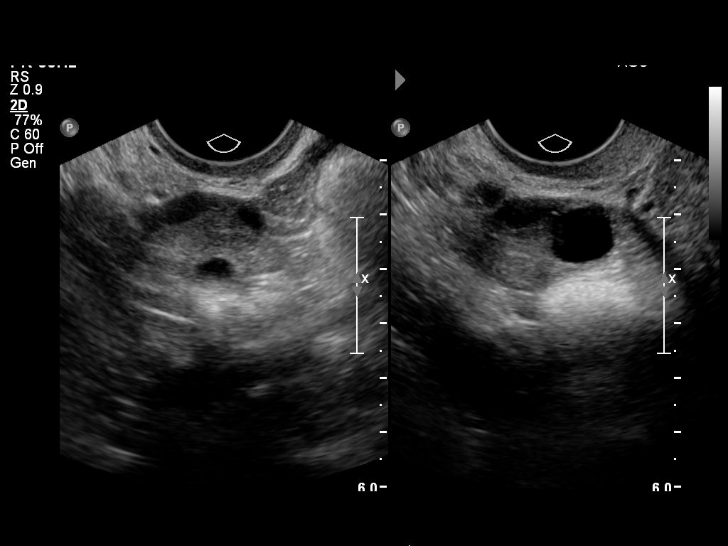
[im 31/41]
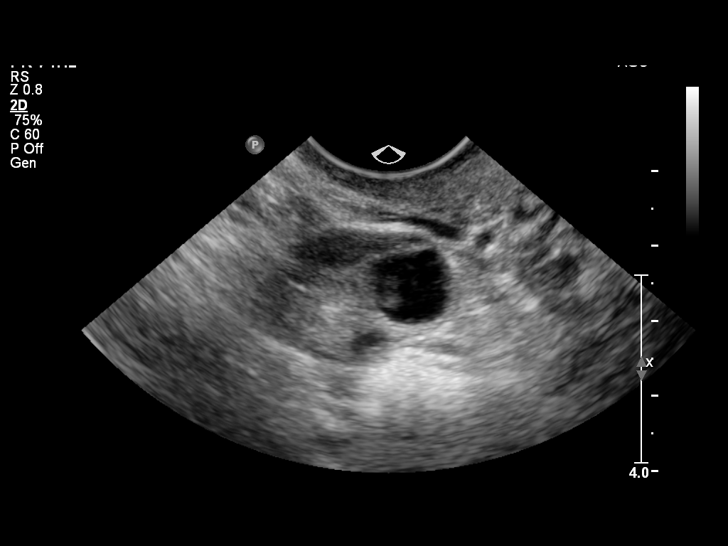
[im 34/41]
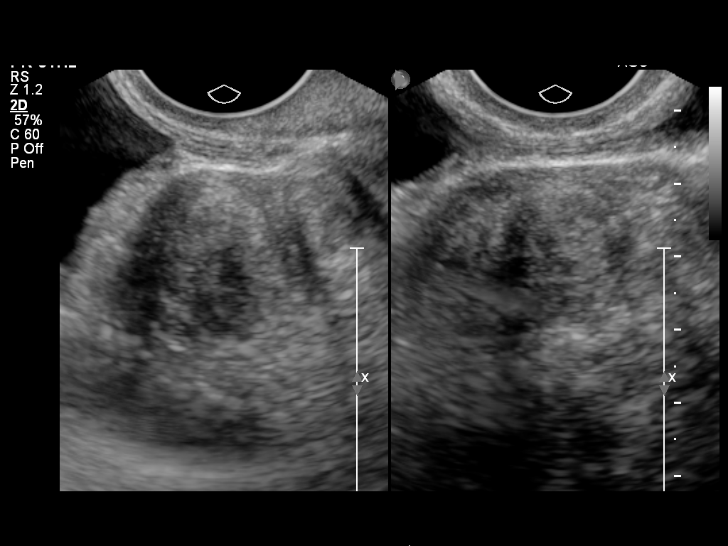
[im 37/41]
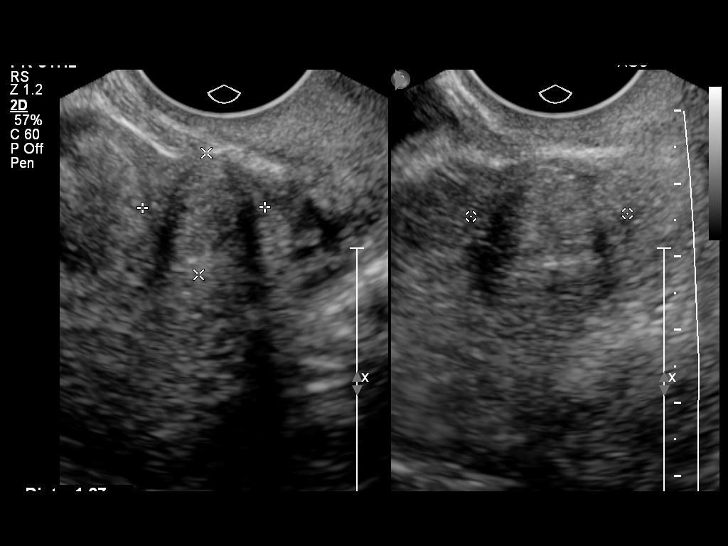
[im 41/41]
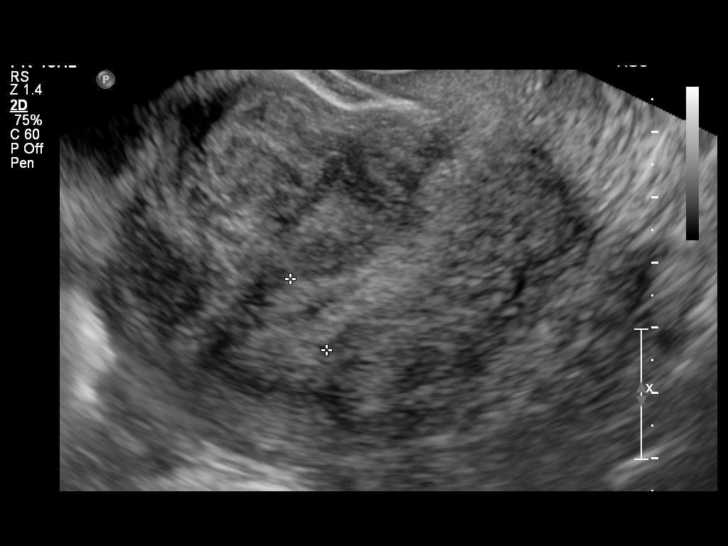

[13 of 25 positions shown; findings below may reference images not displayed]

FINDINGS: Uterus

Measurements: 10.6 x 5.2 x 6.3 cm. Multiple uterine fibroids,
including:

--2.6 x 2.6 x 2.3 cm subserosal anterior fundal fibroid

--1.3 x 1.6 x 1.2 cm subserosal posterior fundal fibroid

--1.7 x 1.7 x 2.1 cm subserosal anterior lower uterine segment
fibroid

Endometrium

Thickness: 12 mm.  No focal abnormality visualized.

Right ovary

Measurements: 3.1 x 1.9 x 2.3 cm. Normal appearance/no adnexal mass.

Left ovary

Not discretely visualized.

Other findings

No free fluid.
IMPRESSION: Multiple uterine fibroids, measuring up to 2.6 cm, as above.

Endometrial complex measures 12 mm, within normal limits.

Left ovary is not discretely visualized.

## 2016-02-22 ENCOUNTER — Other Ambulatory Visit: Payer: Self-pay | Admitting: Internal Medicine

## 2016-03-14 ENCOUNTER — Ambulatory Visit: Payer: BC Managed Care – PPO | Attending: Family Medicine | Admitting: Family Medicine

## 2016-03-14 ENCOUNTER — Encounter: Payer: Self-pay | Admitting: Family Medicine

## 2016-03-14 VITALS — BP 120/80 | HR 95 | Temp 97.5°F | Ht 64.0 in | Wt 284.0 lb

## 2016-03-14 DIAGNOSIS — Z79899 Other long term (current) drug therapy: Secondary | ICD-10-CM | POA: Insufficient documentation

## 2016-03-14 DIAGNOSIS — IMO0001 Reserved for inherently not codable concepts without codable children: Secondary | ICD-10-CM

## 2016-03-14 DIAGNOSIS — E7800 Pure hypercholesterolemia, unspecified: Secondary | ICD-10-CM

## 2016-03-14 DIAGNOSIS — E119 Type 2 diabetes mellitus without complications: Secondary | ICD-10-CM

## 2016-03-14 DIAGNOSIS — Z6841 Body Mass Index (BMI) 40.0 and over, adult: Secondary | ICD-10-CM | POA: Insufficient documentation

## 2016-03-14 DIAGNOSIS — I10 Essential (primary) hypertension: Secondary | ICD-10-CM

## 2016-03-14 DIAGNOSIS — F411 Generalized anxiety disorder: Secondary | ICD-10-CM

## 2016-03-14 DIAGNOSIS — E6609 Other obesity due to excess calories: Secondary | ICD-10-CM

## 2016-03-14 DIAGNOSIS — Z794 Long term (current) use of insulin: Secondary | ICD-10-CM | POA: Insufficient documentation

## 2016-03-14 DIAGNOSIS — F329 Major depressive disorder, single episode, unspecified: Secondary | ICD-10-CM | POA: Insufficient documentation

## 2016-03-14 LAB — GLUCOSE, POCT (MANUAL RESULT ENTRY): POC GLUCOSE: 138 mg/dL — AB (ref 70–99)

## 2016-03-14 LAB — POCT GLYCOSYLATED HEMOGLOBIN (HGB A1C): Hemoglobin A1C: 6.7

## 2016-03-14 MED ORDER — FERROUS SULFATE 325 (65 FE) MG PO TABS: ORAL_TABLET | ORAL | 2 refills | Status: DC

## 2016-03-14 MED ORDER — LIRAGLUTIDE 18 MG/3ML ~~LOC~~ SOPN: PEN_INJECTOR | SUBCUTANEOUS | 3 refills | Status: DC

## 2016-03-14 MED ORDER — MELOXICAM 7.5 MG PO TABS: 7.5000 mg | ORAL_TABLET | Freq: Every day | ORAL | 3 refills | Status: DC

## 2016-03-14 MED ORDER — LISINOPRIL 5 MG PO TABS: 2.5000 mg | ORAL_TABLET | Freq: Every day | ORAL | 3 refills | Status: DC

## 2016-03-14 MED ORDER — METFORMIN HCL 500 MG PO TABS: ORAL_TABLET | ORAL | 3 refills | Status: DC

## 2016-03-14 MED ORDER — GLIPIZIDE 10 MG PO TABS: ORAL_TABLET | ORAL | 3 refills | Status: DC

## 2016-03-14 MED ORDER — ATORVASTATIN CALCIUM 20 MG PO TABS: 20.0000 mg | ORAL_TABLET | Freq: Every day | ORAL | 3 refills | Status: DC

## 2016-03-14 NOTE — Progress Notes (Signed)
Subjective:  Patient ID: Meghan Finley, female    DOB: 1970/03/28  Age: 45 y.o. MRN: [id number]  CC: Diabetes and Hyperlipidemia   HPI Meghan Finley is a 45 year old female with a history of type 2 diabetes mellitus (A1c 6.7down from 7.4 previousl) hypertension, hyperlipidemia, anxiety,Obesity who comes in today for a follow-up visit.  Since her last office visit she has gained 11 pounds. She endorses not being as active as she previously was and sometimes does "cheat" with regards to her meals. She has been compliant with metformin and Victoza. Fasting sugars have been in the 130s but she occasionally has a 170.  Anxiety is managed by mental health and she recently had Trazodone added to her regimen Doing well on her antihypertensives and tolerating all her medications.  Past Medical History:  Diagnosis Date  . Anemia   . Anxiety   . Depression   . Diabetes mellitus   . Hyperlipidemia   . Hypertension   . Morbid obesity ([place])     Past Surgical History:  Procedure Laterality Date  . TOOTH EXTRACTION      Not on File   Outpatient Medications Prior to Visit  Medication Sig Dispense Refill  . Blood Glucose Monitoring Suppl (ONE TOUCH ULTRA SYSTEM KIT) W/DEVICE KIT 1 kit by Does not apply route once. 1 each 0  . Cholecalciferol (VITAMIN D-3) 1000 units CAPS Take 2 capsules by mouth daily.    . clonazePAM (KLONOPIN) 1 MG tablet Take 1 mg by mouth 2 (two) times daily as needed for anxiety.    Marland Kitchen glucose blood (ONE TOUCH ULTRA TEST) test strip Use three times daily, before meals, and at bedtime 100 each 12  . omega-3 acid ethyl esters (LOVAZA) 1 g capsule Take 2 g by mouth daily. Reported on 10/07/2015    . ONETOUCH DELICA LANCETS 36U MISC 1 each by Does not apply route 4 (four) times daily -  before meals and at bedtime. 100 each 11  . atorvastatin (LIPITOR) 20 MG tablet Take 1 tablet (20 mg total) by mouth daily. 30 tablet 3  . glipiZIDE (GLUCOTROL) 10 MG tablet TAKE 1 TABLET  BY MOUTH 2 TIMES DAILY BEFORE A MEAL. 60 tablet 3  . Liraglutide (VICTOZA) 18 MG/3ML SOPN Inject subcutaneously 1.8 mg daily. 3 pen 3  . lisinopril (PRINIVIL,ZESTRIL) 5 MG tablet Take 0.5 tablets (2.5 mg total) by mouth daily. 15 tablet 3  . meloxicam (MOBIC) 7.5 MG tablet Take 1 tablet (7.5 mg total) by mouth daily. 30 tablet 3  . metFORMIN (GLUCOPHAGE) 500 MG tablet Take orally 3 tabs (1575m) in the morning and 2 tabs (10071m in the evening 150 tablet 3  . fexofenadine (ALLEGRA ALLERGY) 60 MG tablet Take 1 tablet (60 mg total) by mouth 2 (two) times daily. (Patient not taking: Reported on 03/14/2016) 60 tablet 1  . fluticasone (FLONASE) 50 MCG/ACT nasal spray Place 1 spray into both nostrils daily.    . Olopatadine HCl (PATADAY) 0.2 % SOLN Apply 1 drop to eye daily. Left eye (Patient not taking: Reported on 03/14/2016) 2.5 mL 0  . Probiotic Product (PROBIOTIC + OMEGA-3 PO) Take 1 tablet by mouth daily. Reported on 10/07/2015    . amoxicillin (AMOXIL) 500 MG capsule Take 1 capsule (500 mg total) by mouth 3 (three) times daily. 30 capsule 0  . ferrous sulfate 325 (65 FE) MG tablet TAKE 1 TABLET (325 MG TOTAL) BY MOUTH DAILY WITH BREAKFAST. (Patient not taking: Reported on 03/14/2016)  90 tablet 0  . metroNIDAZOLE (METROGEL VAGINAL) 0.75 % vaginal gel Place 1 Applicatorful vaginally at bedtime. (Patient not taking: Reported on 12/21/2015) 70 g 0  . traMADol (ULTRAM) 50 MG tablet Take 1 tablet (50 mg total) by mouth every 6 (six) hours as needed. For pain (Patient not taking: Reported on 03/14/2016) 10 tablet 0   No facility-administered medications prior to visit.     ROS Review of Systems  Constitutional: Negative for activity change, appetite change and fatigue.  HENT: Negative for congestion, sinus pressure and sore throat.   Eyes: Negative for visual disturbance.  Respiratory: Negative for cough, chest tightness, shortness of breath and wheezing.   Cardiovascular: Negative for chest pain and  palpitations.  Gastrointestinal: Negative for abdominal distention, abdominal pain and constipation.  Endocrine: Negative for polydipsia.  Genitourinary: Negative for dysuria and frequency.  Musculoskeletal: Negative for arthralgias and back pain.  Skin: Negative for rash.  Neurological: Negative for tremors, light-headedness and numbness.  Hematological: Does not bruise/bleed easily.  Psychiatric/Behavioral: Negative for agitation and behavioral problems.    Objective:  BP 120/80 (BP Location: Right Arm, Patient Position: Sitting, Cuff Size: Large)   Pulse 95   Temp 97.5 F (36.4 C) (Oral)   Ht 5' 4" (1.626 m)   Wt 284 lb (128.8 kg)   SpO2 100%   BMI 48.75 kg/m   BP/Weight 03/14/2016 12/21/2015 4/70/9628  Systolic BP 366 294 765  Diastolic BP 80 67 80  Wt. (Lbs) 284 273.4 272.8  BMI 48.75 46.93 46.83      Physical Exam  Constitutional: She is oriented to person, place, and time. She appears well-developed and well-nourished.  Cardiovascular: Normal rate, normal heart sounds and intact distal pulses.   No murmur heard. Pulmonary/Chest: Effort normal and breath sounds normal. She has no wheezes. She has no rales. She exhibits no tenderness.  Abdominal: Soft. Bowel sounds are normal. She exhibits no distension and no mass. There is no tenderness.  Musculoskeletal: Normal range of motion.  Neurological: She is alert and oriented to person, place, and time.  Skin: Skin is warm and dry.  Psychiatric:  Dysphoric mood     Lab Results  Component Value Date   HGBA1C 6.7 03/14/2016    CMP Latest Ref Rng & Units 07/28/2015 07/26/2015 04/21/2015  Glucose 65 - 99 mg/dL 221(H) 207(H) 265(H)  BUN 6 - 20 mg/dL _0 Creatinine 0.44 - 1.00 mg/dL 0.66 0.72 0.70  Sodium 135 - 145 mmol/L 135 135 137  Potassium 3.5 - 5.1 mmol/L 3.9 3.6 5.0  Chloride 101 - 111 mmol/L 102 101 99  CO2 22 - 32 mmol/L _1 Calcium 8.9 - 10.3 mg/dL 8.8(L) 8.9 9.2  Total Protein 6.0 - 8.3 g/dL - - -   Total Bilirubin 0.2 - 1.2 mg/dL - - -  Alkaline Phos 39 - 117 U/L - - -  AST 0 - 37 U/L - - -  ALT 0 - 35 U/L - - -    Lipid Panel     Component Value Date/Time   CHOL 128 07/28/2015 0315   TRIG 70 07/28/2015 0315   HDL 38 (L) 07/28/2015 0315   CHOLHDL 3.4 07/28/2015 0315   VLDL 14 07/28/2015 0315   LDLCALC 76 07/28/2015 0315    Assessment & Plan:   1. Type 2 diabetes mellitus without complication, without long-term current use of insulin (HCC) Controlled with A1c of 6.7 Continue Diabetic diet and lifestyle modifications - Glucose (CBG) -  HgB A1c - metFORMIN (GLUCOPHAGE) 500 MG tablet; Take orally 3 tabs (1500mg) in the morning and 2 tabs (1000mg) in the evening  Dispense: 150 tablet; Refill: 3 - liraglutide (VICTOZA) 18 MG/3ML SOPN; Inject subcutaneously 1.8 mg daily.  Dispense: 3 pen; Refill: 3 - glipiZIDE (GLUCOTROL) 10 MG tablet; TAKE 1 TABLET BY MOUTH 2 TIMES DAILY BEFORE A MEAL.  Dispense: 60 tablet; Refill: 3  2. Essential hypertension Controlled - lisinopril (PRINIVIL,ZESTRIL) 5 MG tablet; Take 0.5 tablets (2.5 mg total) by mouth daily.  Dispense: 15 tablet; Refill: 3  3. Pure hypercholesterolemia Controlled - atorvastatin (LIPITOR) 20 MG tablet; Take 1 tablet (20 mg total) by mouth daily.  Dispense: 30 tablet; Refill: 3  4. Generalized anxiety disorder Continue Klonopin Keep appointment with Psych  5. Class 3 obesity due to excess calories without serious comorbidity with body mass index (BMI) of 40.0 to 44.9 in adult (HCC) Advised on reducing portion sizes and increasing physical activity   Meds ordered this encounter  Medications  . metFORMIN (GLUCOPHAGE) 500 MG tablet    Sig: Take orally 3 tabs (1500mg) in the morning and 2 tabs (1000mg) in the evening    Dispense:  150 tablet    Refill:  3  . lisinopril (PRINIVIL,ZESTRIL) 5 MG tablet    Sig: Take 0.5 tablets (2.5 mg total) by mouth daily.    Dispense:  15 tablet    Refill:  3  . liraglutide  (VICTOZA) 18 MG/3ML SOPN    Sig: Inject subcutaneously 1.8 mg daily.    Dispense:  3 pen    Refill:  3  . glipiZIDE (GLUCOTROL) 10 MG tablet    Sig: TAKE 1 TABLET BY MOUTH 2 TIMES DAILY BEFORE A MEAL.    Dispense:  60 tablet    Refill:  3  . ferrous sulfate 325 (65 FE) MG tablet    Sig: TAKE 1 TABLET (325 MG TOTAL) BY MOUTH DAILY WITH BREAKFAST.    Dispense:  90 tablet    Refill:  2  . atorvastatin (LIPITOR) 20 MG tablet    Sig: Take 1 tablet (20 mg total) by mouth daily.    Dispense:  30 tablet    Refill:  3  . meloxicam (MOBIC) 7.5 MG tablet    Sig: Take 1 tablet (7.5 mg total) by mouth daily.    Dispense:  30 tablet    Refill:  3    Follow-up: Return in about 3 months (around 06/12/2016) for follow up on Diabetes.   Enobong Amao MD   

## 2016-03-29 ENCOUNTER — Other Ambulatory Visit: Payer: Self-pay | Admitting: Family Medicine

## 2016-03-29 DIAGNOSIS — E119 Type 2 diabetes mellitus without complications: Secondary | ICD-10-CM

## 2016-03-30 ENCOUNTER — Other Ambulatory Visit: Payer: Self-pay | Admitting: Family Medicine

## 2016-03-30 DIAGNOSIS — I10 Essential (primary) hypertension: Secondary | ICD-10-CM

## 2016-04-04 MED FILL — DOXEPIN 25 MG CAPSULE: 25 | 30 days supply | Qty: 60 | Fill #1

## 2016-04-04 MED FILL — clonazePAM 1 MG TABS: 1 | 30 days supply | Qty: 60 | Fill #1

## 2016-05-08 MED FILL — clonazePAM 1 MG TABS: 1 | 30 days supply | Qty: 60 | Fill #2

## 2016-05-28 ENCOUNTER — Other Ambulatory Visit: Payer: Self-pay | Admitting: Family Medicine

## 2016-05-28 DIAGNOSIS — E119 Type 2 diabetes mellitus without complications: Secondary | ICD-10-CM

## 2016-05-28 DIAGNOSIS — E7800 Pure hypercholesterolemia, unspecified: Secondary | ICD-10-CM

## 2016-06-19 ENCOUNTER — Other Ambulatory Visit: Payer: Self-pay | Admitting: Family Medicine

## 2016-06-19 ENCOUNTER — Encounter: Payer: Self-pay | Admitting: Family Medicine

## 2016-06-19 ENCOUNTER — Ambulatory Visit: Payer: BC Managed Care – PPO | Attending: Family Medicine | Admitting: Family Medicine

## 2016-06-19 VITALS — BP 126/77 | HR 93 | Temp 98.1°F | Ht 64.0 in | Wt 276.2 lb

## 2016-06-19 DIAGNOSIS — F419 Anxiety disorder, unspecified: Secondary | ICD-10-CM | POA: Insufficient documentation

## 2016-06-19 DIAGNOSIS — J302 Other seasonal allergic rhinitis: Secondary | ICD-10-CM | POA: Diagnosis not present

## 2016-06-19 DIAGNOSIS — Z6841 Body Mass Index (BMI) 40.0 and over, adult: Secondary | ICD-10-CM | POA: Insufficient documentation

## 2016-06-19 DIAGNOSIS — Z79899 Other long term (current) drug therapy: Secondary | ICD-10-CM | POA: Diagnosis not present

## 2016-06-19 DIAGNOSIS — E119 Type 2 diabetes mellitus without complications: Secondary | ICD-10-CM

## 2016-06-19 DIAGNOSIS — J309 Allergic rhinitis, unspecified: Secondary | ICD-10-CM | POA: Insufficient documentation

## 2016-06-19 DIAGNOSIS — Z794 Long term (current) use of insulin: Secondary | ICD-10-CM | POA: Insufficient documentation

## 2016-06-19 DIAGNOSIS — I10 Essential (primary) hypertension: Secondary | ICD-10-CM

## 2016-06-19 DIAGNOSIS — F329 Major depressive disorder, single episode, unspecified: Secondary | ICD-10-CM | POA: Diagnosis not present

## 2016-06-19 DIAGNOSIS — E7800 Pure hypercholesterolemia, unspecified: Secondary | ICD-10-CM

## 2016-06-19 LAB — POCT GLYCOSYLATED HEMOGLOBIN (HGB A1C): Hemoglobin A1C: 6.9

## 2016-06-19 LAB — GLUCOSE, POCT (MANUAL RESULT ENTRY): POC GLUCOSE: 65 mg/dL — AB (ref 70–99)

## 2016-06-19 MED ORDER — LISINOPRIL 5 MG PO TABS: 2.5000 mg | ORAL_TABLET | Freq: Every day | ORAL | 1 refills | Status: DC

## 2016-06-19 MED ORDER — ATORVASTATIN CALCIUM 20 MG PO TABS: 20.0000 mg | ORAL_TABLET | Freq: Every day | ORAL | 1 refills | Status: DC

## 2016-06-19 MED ORDER — LIRAGLUTIDE 18 MG/3ML ~~LOC~~ SOPN: PEN_INJECTOR | SUBCUTANEOUS | 3 refills | Status: DC

## 2016-06-19 MED ORDER — METFORMIN HCL 500 MG PO TABS: ORAL_TABLET | ORAL | 3 refills | Status: DC

## 2016-06-19 MED ORDER — MELOXICAM 7.5 MG PO TABS: 7.5000 mg | ORAL_TABLET | Freq: Every day | ORAL | 3 refills | Status: DC

## 2016-06-19 MED ORDER — GLIPIZIDE 10 MG PO TABS: ORAL_TABLET | ORAL | 1 refills | Status: AC

## 2016-06-19 MED ORDER — GLIPIZIDE 10 MG PO TABS: ORAL_TABLET | ORAL | 1 refills | Status: DC

## 2016-06-19 MED FILL — clonazePAM 1 MG TABS: 1 | 30 days supply | Qty: 60 | Fill #0

## 2016-06-19 NOTE — Progress Notes (Signed)
Subjective:  Patient ID: Meghan Finley, female    DOB: 01/13/1971  Age: 47 y.o. MRN: [id number]  CC: Follow-up on diabetes mellitus  HPI Meghan Finley is a 46 year old female with a history of type 2 diabetes mellitus (A1c 6.9 ) hypertension, hyperlipidemia, anxiety,Obesity who comes in today for a follow-up visit.  Since her last office visit she has lost gained 8 pounds. She has been compliant with metformin, Glipizide and Victoza. Fasting sugars have been in the 130s but she occasionally has a 170. She is physically active and coaches softball. Denies hypoglycemia; has not had a recent eye exam.  Anxiety is managed by mental health where she receives Klonopin and trazodone.  Doing well on her antihypertensives and tolerating all her medications.  She has no complaints today.  Past Medical History:  Diagnosis Date  . Anemia   . Anxiety   . Depression   . Diabetes mellitus   . Hyperlipidemia   . Hypertension   . Morbid obesity ([place])     Past Surgical History:  Procedure Laterality Date  . TOOTH EXTRACTION      Not on File   Outpatient Medications Prior to Visit  Medication Sig Dispense Refill  . Blood Glucose Monitoring Suppl (ONE TOUCH ULTRA SYSTEM KIT) W/DEVICE KIT 1 kit by Does not apply route once. 1 each 0  . clonazePAM (KLONOPIN) 1 MG tablet Take 1 mg by mouth 2 (two) times daily as needed for anxiety.    . ferrous sulfate 325 (65 FE) MG tablet TAKE 1 TABLET (325 MG TOTAL) BY MOUTH DAILY WITH BREAKFAST. 90 tablet 2  . fexofenadine (ALLEGRA ALLERGY) 60 MG tablet Take 1 tablet (60 mg total) by mouth 2 (two) times daily. 60 tablet 1  . fluticasone (FLONASE) 50 MCG/ACT nasal spray Place 1 spray into both nostrils daily.    Marland Kitchen glucose blood (ONE TOUCH ULTRA TEST) test strip Use three times daily, before meals, and at bedtime 100 each 12  . omega-3 acid ethyl esters (LOVAZA) 1 g capsule Take 2 g by mouth daily. Reported on 10/07/2015    . ONETOUCH DELICA LANCETS 27O  MISC 1 each by Does not apply route 4 (four) times daily -  before meals and at bedtime. 100 each 11  . atorvastatin (LIPITOR) 20 MG tablet TAKE 1 TABLET BY MOUTH EVERY DAY 90 tablet 0  . glipiZIDE (GLUCOTROL) 10 MG tablet TAKE 1 TABLET BY MOUTH TWICE DAILY BEFORE A MEAL 60 tablet 0  . liraglutide (VICTOZA) 18 MG/3ML SOPN Inject subcutaneously 1.8 mg daily. 3 pen 3  . lisinopril (PRINIVIL,ZESTRIL) 5 MG tablet Take 0.5 tablets (2.5 mg total) by mouth daily. 15 tablet 3  . meloxicam (MOBIC) 7.5 MG tablet Take 1 tablet (7.5 mg total) by mouth daily. 30 tablet 3  . metFORMIN (GLUCOPHAGE) 500 MG tablet Take orally 3 tabs (1544m) in the morning and 2 tabs (1003m in the evening 150 tablet 3  . Cholecalciferol (VITAMIN D-3) 1000 units CAPS Take 2 capsules by mouth daily.    . Olopatadine HCl (PATADAY) 0.2 % SOLN Apply 1 drop to eye daily. Left eye (Patient not taking: Reported on 03/14/2016) 2.5 mL 0  . Probiotic Product (PROBIOTIC + OMEGA-3 PO) Take 1 tablet by mouth daily. Reported on 10/07/2015     No facility-administered medications prior to visit.     ROS Review of Systems  Constitutional: Negative for activity change, appetite change and fatigue.  HENT: Negative for congestion, sinus pressure  and sore throat.   Eyes: Negative for visual disturbance.  Respiratory: Negative for cough, chest tightness, shortness of breath and wheezing.   Cardiovascular: Negative for chest pain and palpitations.  Gastrointestinal: Negative for abdominal distention, abdominal pain and constipation.  Endocrine: Negative for polydipsia.  Genitourinary: Negative for dysuria and frequency.  Musculoskeletal: Negative for arthralgias and back pain.  Skin: Negative for rash.  Neurological: Negative for tremors, light-headedness and numbness.  Hematological: Does not bruise/bleed easily.  Psychiatric/Behavioral: Negative for agitation and behavioral problems.    Objective:  BP 126/77 (BP Location: Right Arm,  Patient Position: Sitting, Cuff Size: Large)   Pulse 93   Temp 98.1 F (36.7 C) (Oral)   Ht _0  (1.626 m)   Wt 276 lb 3.2 oz (125.3 kg)   SpO2 99%   BMI 47.41 kg/m   BP/Weight 06/19/2016 03/14/2016 9/75/3005  Systolic BP 110 211 173  Diastolic BP 77 80 67  Wt. (Lbs) 276.2 284 273.4  BMI 47.41 48.75 46.93      Physical Exam  Constitutional: She is oriented to person, place, and time. She appears well-developed and well-nourished.  Neck: No JVD present.  Cardiovascular: Normal rate, normal heart sounds and intact distal pulses.   No murmur heard. Pulmonary/Chest: Effort normal and breath sounds normal. She has no wheezes. She has no rales. She exhibits no tenderness.  Abdominal: Soft. Bowel sounds are normal. She exhibits no distension and no mass. There is no tenderness.  Musculoskeletal: Normal range of motion.  Neurological: She is alert and oriented to person, place, and time.  Skin: Skin is warm and dry.  Psychiatric: She has a normal mood and affect.     Lab Results  Component Value Date   HGBA1C 6.9 06/19/2016    CMP Latest Ref Rng & Units 07/28/2015 07/26/2015 04/21/2015  Glucose 65 - 99 mg/dL 221(H) 207(H) 265(H)  BUN 6 - 20 mg/dL _1 Creatinine 0.44 - 1.00 mg/dL 0.66 0.72 0.70  Sodium 135 - 145 mmol/L 135 135 137  Potassium 3.5 - 5.1 mmol/L 3.9 3.6 5.0  Chloride 101 - 111 mmol/L 102 101 99  CO2 22 - 32 mmol/L _2 Calcium 8.9 - 10.3 mg/dL 8.8(L) 8.9 9.2  Total Protein 6.0 - 8.3 g/dL - - -  Total Bilirubin 0.2 - 1.2 mg/dL - - -  Alkaline Phos 39 - 117 U/L - - -  AST 0 - 37 U/L - - -  ALT 0 - 35 U/L - - -    Lipid Panel     Component Value Date/Time   CHOL 128 07/28/2015 0315   TRIG 70 07/28/2015 0315   HDL 38 (L) 07/28/2015 0315   CHOLHDL 3.4 07/28/2015 0315   VLDL 14 07/28/2015 0315   LDLCALC 76 07/28/2015 0315     Assessment & Plan:   1. Type 2 diabetes mellitus without complication, without long-term current use of insulin  (HCC) Controlled with A1c of 6.9 Foot exam performed today Continue diabetic diet, lifestyle modification - Glucose (CBG) - HgB A1c - glipiZIDE (GLUCOTROL) 10 MG tablet; TAKE 1 TABLET BY MOUTH TWICE DAILY BEFORE A MEAL  Dispense: 180 tablet; Refill: 1 - liraglutide (VICTOZA) 18 MG/3ML SOPN; Inject subcutaneously 1.8 mg daily.  Dispense: 3 pen; Refill: 3 - metFORMIN (GLUCOPHAGE) 500 MG tablet; Take orally 3 tabs (1558m) in the morning and 2 tabs (10035m in the evening  Dispense: 150 tablet; Refill: 3 - Comprehensive metabolic panel  2. Pure hypercholesterolemia  Controlled Low-cholesterol diet - atorvastatin (LIPITOR) 20 MG tablet; Take 1 tablet (20 mg total) by mouth daily.  Dispense: 90 tablet; Refill: 1  3. Essential hypertension Controlled - lisinopril (PRINIVIL,ZESTRIL) 5 MG tablet; Take 0.5 tablets (2.5 mg total) by mouth daily.  Dispense: 45 tablet; Refill: 1  4. Chronic seasonal allergic rhinitis due to other allergen Stable Continue Flonase, Allegra   Meds ordered this encounter  Medications  . glipiZIDE (GLUCOTROL) 10 MG tablet    Sig: TAKE 1 TABLET BY MOUTH TWICE DAILY BEFORE A MEAL    Dispense:  180 tablet    Refill:  1  . liraglutide (VICTOZA) 18 MG/3ML SOPN    Sig: Inject subcutaneously 1.8 mg daily.    Dispense:  3 pen    Refill:  3  . meloxicam (MOBIC) 7.5 MG tablet    Sig: Take 1 tablet (7.5 mg total) by mouth daily.    Dispense:  30 tablet    Refill:  3  . metFORMIN (GLUCOPHAGE) 500 MG tablet    Sig: Take orally 3 tabs (1517m) in the morning and 2 tabs (10018m in the evening    Dispense:  150 tablet    Refill:  3  . atorvastatin (LIPITOR) 20 MG tablet    Sig: Take 1 tablet (20 mg total) by mouth daily.    Dispense:  90 tablet    Refill:  1  . lisinopril (PRINIVIL,ZESTRIL) 5 MG tablet    Sig: Take 0.5 tablets (2.5 mg total) by mouth daily.    Dispense:  45 tablet    Refill:  1    Follow-up: Return in about 3 months (around 09/19/2016) for  Follow-up on chronic medical conditions.   EnArnoldo MoraleD

## 2016-06-19 NOTE — Patient Instructions (Signed)

## 2016-06-20 LAB — COMPREHENSIVE METABOLIC PANEL
ALBUMIN: 4.3 g/dL (ref 3.5–5.5)
ALT: 16 IU/L (ref 0–32)
AST: 15 IU/L (ref 0–40)
Albumin/Globulin Ratio: 1.5 (ref 1.2–2.2)
Alkaline Phosphatase: 57 IU/L (ref 39–117)
BUN / CREAT RATIO: 19 (ref 9–23)
BUN: 13 mg/dL (ref 6–24)
Bilirubin Total: 0.4 mg/dL (ref 0.0–1.2)
CO2: 20 mmol/L (ref 18–29)
CREATININE: 0.7 mg/dL (ref 0.57–1.00)
Calcium: 9.5 mg/dL (ref 8.7–10.2)
Chloride: 101 mmol/L (ref 96–106)
GFR calc non Af Amer: 106 mL/min/{1.73_m2} (ref >59)
GFR, EST AFRICAN AMERICAN: 123 mL/min/{1.73_m2} (ref >59)
GLUCOSE: 75 mg/dL (ref 65–99)
Globulin, Total: 2.8 g/dL (ref 1.5–4.5)
Potassium: 4.3 mmol/L (ref 3.5–5.2)
Sodium: 140 mmol/L (ref 134–144)
Total Protein: 7.1 g/dL (ref 6.0–8.5)

## 2016-06-27 ENCOUNTER — Telehealth: Payer: Self-pay

## 2016-06-27 NOTE — Telephone Encounter (Signed)
Writer called patient with lab results.  LVM requesting patient to call back.

## 2016-06-28 NOTE — Progress Notes (Signed)
Writer sent lab results and a letter to patient after several attempts to call her with results.

## 2016-08-05 ENCOUNTER — Other Ambulatory Visit: Payer: Self-pay | Admitting: Family Medicine

## 2016-08-05 DIAGNOSIS — E119 Type 2 diabetes mellitus without complications: Secondary | ICD-10-CM

## 2016-08-09 MED FILL — clonazePAM 1 MG TABS: 1 | 30 days supply | Qty: 60 | Fill #1

## 2016-08-09 MED FILL — DOXEPIN 25 MG CAPSULE: 25 | 30 days supply | Qty: 60 | Fill #2

## 2016-08-24 ENCOUNTER — Other Ambulatory Visit: Payer: Self-pay | Admitting: Family Medicine

## 2016-08-24 DIAGNOSIS — E119 Type 2 diabetes mellitus without complications: Secondary | ICD-10-CM

## 2016-08-27 ENCOUNTER — Other Ambulatory Visit: Payer: Self-pay | Admitting: Pharmacist

## 2016-08-27 DIAGNOSIS — E119 Type 2 diabetes mellitus without complications: Secondary | ICD-10-CM

## 2016-08-27 MED ORDER — LIRAGLUTIDE 18 MG/3ML ~~LOC~~ SOPN: PEN_INJECTOR | SUBCUTANEOUS | 0 refills | Status: DC

## 2016-08-30 ENCOUNTER — Ambulatory Visit: Payer: Self-pay | Admitting: Family Medicine

## 2016-09-10 ENCOUNTER — Other Ambulatory Visit: Payer: Self-pay | Admitting: Family Medicine

## 2016-09-14 ENCOUNTER — Encounter: Payer: Self-pay | Admitting: Family Medicine

## 2016-09-14 ENCOUNTER — Ambulatory Visit: Payer: BC Managed Care – PPO | Attending: Family Medicine | Admitting: Family Medicine

## 2016-09-14 VITALS — BP 120/79 | HR 99 | Temp 97.8°F | Wt 273.4 lb

## 2016-09-14 DIAGNOSIS — Z794 Long term (current) use of insulin: Secondary | ICD-10-CM

## 2016-09-14 DIAGNOSIS — Z79899 Other long term (current) drug therapy: Secondary | ICD-10-CM | POA: Insufficient documentation

## 2016-09-14 DIAGNOSIS — Z1231 Encounter for screening mammogram for malignant neoplasm of breast: Secondary | ICD-10-CM | POA: Diagnosis not present

## 2016-09-14 DIAGNOSIS — E119 Type 2 diabetes mellitus without complications: Secondary | ICD-10-CM | POA: Diagnosis not present

## 2016-09-14 DIAGNOSIS — F329 Major depressive disorder, single episode, unspecified: Secondary | ICD-10-CM | POA: Insufficient documentation

## 2016-09-14 DIAGNOSIS — Z7984 Long term (current) use of oral hypoglycemic drugs: Secondary | ICD-10-CM | POA: Insufficient documentation

## 2016-09-14 DIAGNOSIS — J328 Other chronic sinusitis: Secondary | ICD-10-CM | POA: Diagnosis not present

## 2016-09-14 DIAGNOSIS — D509 Iron deficiency anemia, unspecified: Secondary | ICD-10-CM | POA: Insufficient documentation

## 2016-09-14 DIAGNOSIS — E1165 Type 2 diabetes mellitus with hyperglycemia: Secondary | ICD-10-CM

## 2016-09-14 DIAGNOSIS — J329 Chronic sinusitis, unspecified: Secondary | ICD-10-CM | POA: Diagnosis not present

## 2016-09-14 DIAGNOSIS — I10 Essential (primary) hypertension: Secondary | ICD-10-CM

## 2016-09-14 DIAGNOSIS — F419 Anxiety disorder, unspecified: Secondary | ICD-10-CM | POA: Diagnosis not present

## 2016-09-14 DIAGNOSIS — E7800 Pure hypercholesterolemia, unspecified: Secondary | ICD-10-CM

## 2016-09-14 DIAGNOSIS — Z1239 Encounter for other screening for malignant neoplasm of breast: Secondary | ICD-10-CM

## 2016-09-14 LAB — GLUCOSE, POCT (MANUAL RESULT ENTRY): POC Glucose: 146 mg/dl — AB (ref 70–99)

## 2016-09-14 MED ORDER — METFORMIN HCL 500 MG PO TABS: ORAL_TABLET | ORAL | 1 refills | Status: AC

## 2016-09-14 MED ORDER — MELOXICAM 7.5 MG PO TABS: ORAL_TABLET | ORAL | 1 refills | Status: AC

## 2016-09-14 MED ORDER — ATORVASTATIN CALCIUM 20 MG PO TABS
20.0000 mg | ORAL_TABLET | Freq: Every day | ORAL | 1 refills | Status: AC
Start: 2016-09-14 — End: ?

## 2016-09-14 MED ORDER — FEXOFENADINE HCL 60 MG PO TABS: 60.0000 mg | ORAL_TABLET | Freq: Two times a day (BID) | ORAL | 1 refills | Status: AC

## 2016-09-14 MED ORDER — FERROUS SULFATE 325 (65 FE) MG PO TABS: ORAL_TABLET | ORAL | 2 refills | Status: DC

## 2016-09-14 MED ORDER — LISINOPRIL 5 MG PO TABS: 2.5000 mg | ORAL_TABLET | Freq: Every day | ORAL | 1 refills | Status: AC

## 2016-09-14 MED ORDER — LIRAGLUTIDE 18 MG/3ML ~~LOC~~ SOPN: PEN_INJECTOR | SUBCUTANEOUS | 1 refills | Status: AC

## 2016-09-14 NOTE — Progress Notes (Signed)
Subjective:  Patient ID: Meghan Finley, female    DOB: 1970-10-11  Age: 46 y.o. MRN: [id number]  CC: Follow-up   HPI Meghan Finley is a 46 year old female with a history of type 2 diabetes mellitus (A1c 6.9 ) hypertension, hyperlipidemia, anxiety,Obesity who comes in today for a follow-up visit.  She has lost 11 pounds in the last 6 months since commencing the Victoza. She has been compliant with metformin, Glipizide and Victoza. Fasting sugars have been in the 130s but she occasionally has a 170. 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  Anxiety is managed by mental health where she receives Klonopin and trazodone.  Doing well on her antihypertensives and tolerating all her medications.  Her insurance will be running out at the end of next month and so she would like a 90 day supply of all her pills. She has no complaints today.  Past Medical History:  Diagnosis Date  . Anemia   . Anxiety   . Depression   . Diabetes mellitus   . Hyperlipidemia   . Hypertension   . Morbid obesity ([place])     Past Surgical History:  Procedure Laterality Date  . TOOTH EXTRACTION      Not on File   Outpatient Medications Prior to Visit  Medication Sig Dispense Refill  . Blood Glucose Monitoring Suppl (ONE TOUCH ULTRA SYSTEM KIT) W/DEVICE KIT 1 kit by Does not apply route once. 1 each 0  . Cholecalciferol (VITAMIN D-3) 1000 units CAPS Take 2 capsules by mouth daily.    . clonazePAM (KLONOPIN) 1 MG tablet Take 1 mg by mouth 2 (two) times daily as needed for anxiety.    . fluticasone (FLONASE) 50 MCG/ACT nasal spray Place 1 spray into both nostrils daily.    Marland Kitchen glipiZIDE (GLUCOTROL) 10 MG tablet TAKE 1 TABLET BY MOUTH TWICE DAILY BEFORE A MEAL 180 tablet 1  . glucose blood (ONE TOUCH ULTRA TEST) test strip Use three times daily, before meals, and at bedtime 100 each 12  .  ONETOUCH DELICA LANCETS 25W MISC 1 each by Does not apply route 4 (four) times daily -  before meals and at bedtime. 100 each 11  . atorvastatin (LIPITOR) 20 MG tablet Take 1 tablet (20 mg total) by mouth daily. 90 tablet 1  . ferrous sulfate 325 (65 FE) MG tablet TAKE 1 TABLET (325 MG TOTAL) BY MOUTH DAILY WITH BREAKFAST. 90 tablet 2  . fexofenadine (ALLEGRA ALLERGY) 60 MG tablet Take 1 tablet (60 mg total) by mouth 2 (two) times daily. 60 tablet 1  . liraglutide (VICTOZA) 18 MG/3ML SOPN Inject subcutaneously 1.8 mg daily. 3 pen 0  . lisinopril (PRINIVIL,ZESTRIL) 5 MG tablet Take 0.5 tablets (2.5 mg total) by mouth daily. 45 tablet 1  . meloxicam (MOBIC) 7.5 MG tablet TAKE 1 TABLET(7.5 MG) BY MOUTH DAILY 90 tablet 3  . metFORMIN (GLUCOPHAGE) 500 MG tablet TAKE 3 TABLETS BY MOUTH EVERY MORNING AND 2 TABLETS BY MOUTH EVERY EVENING 150 tablet 0  . Olopatadine HCl (PATADAY) 0.2 % SOLN Apply 1 drop to eye daily. Left eye (Patient not taking: Reported on 03/14/2016) 2.5 mL 0  . omega-3 acid ethyl esters (LOVAZA) 1 g capsule Take 2 g by mouth daily. Reported on 10/07/2015    . Probiotic Product (PROBIOTIC + OMEGA-3 PO) Take 1 tablet by  mouth daily. Reported on 10/07/2015     No facility-administered medications prior to visit.     ROS Review of Systems  Constitutional: Negative for activity change, appetite change and fatigue.  HENT: Negative for congestion, sinus pressure and sore throat.   Eyes: Negative for visual disturbance.  Respiratory: Negative for cough, chest tightness, shortness of breath and wheezing.   Cardiovascular: Negative for chest pain and palpitations.  Gastrointestinal: Negative for abdominal distention, abdominal pain and constipation.  Endocrine: Negative for polydipsia.  Genitourinary: Negative for dysuria and frequency.  Musculoskeletal: Negative for arthralgias and back pain.  Skin: Negative for rash.  Neurological: Negative for tremors, light-headedness and numbness.    Hematological: Does not bruise/bleed easily.  Psychiatric/Behavioral: Negative for agitation and behavioral problems.    Objective:  BP 120/79   Pulse 99   Temp 97.8 F (36.6 C) (Oral)   Wt 273 lb 6.4 oz (124 kg)   SpO2 99%   BMI 46.93 kg/m   BP/Weight 09/14/2016 06/19/2016 78/46/9629  Systolic BP 528 413 244  Diastolic BP 79 77 80  Wt. (Lbs) 273.4 276.2 284  BMI 46.93 47.41 48.75      Physical Exam  Constitutional: She is oriented to person, place, and time. She appears well-developed and well-nourished.  HENT:  Right Ear: External ear normal.  Left Ear: External ear normal.  Mouth/Throat: Oropharynx is clear and moist.  Cardiovascular: Normal rate, normal heart sounds and intact distal pulses.   No murmur heard. Pulmonary/Chest: Effort normal and breath sounds normal. She has no wheezes. She has no rales. She exhibits no tenderness.  Abdominal: Soft. Bowel sounds are normal. She exhibits no distension and no mass. There is no tenderness.  Musculoskeletal: Normal range of motion.  Neurological: She is alert and oriented to person, place, and time.  Skin: Skin is warm and dry.  Psychiatric: She has a normal mood and affect.    CMP Latest Ref Rng & Units 06/19/2016 07/28/2015 07/26/2015  Glucose 65 - 99 mg/dL 75 221(H) 207(H)  BUN 6 - 24 mg/dL '13 12 10  '$ Creatinine 0.57 - 1.00 mg/dL 0.70 0.66 0.72  Sodium 134 - 144 mmol/L 140 135 135  Potassium 3.5 - 5.2 mmol/L 4.3 3.9 3.6  Chloride 96 - 106 mmol/L 101 102 101  CO2 18 - 29 mmol/L '20 23 25  '$ Calcium 8.7 - 10.2 mg/dL 9.5 8.8(L) 8.9  Total Protein 6.0 - 8.5 g/dL 7.1 - -  Total Bilirubin 0.0 - 1.2 mg/dL 0.4 - -  Alkaline Phos 39 - 117 IU/L 57 - -  AST 0 - 40 IU/L 15 - -  ALT 0 - 32 IU/L 16 - -    Lipid Panel     Component Value Date/Time   CHOL 128 07/28/2015 0315   TRIG 70 07/28/2015 0315   HDL 38 (L) 07/28/2015 0315   CHOLHDL 3.4 07/28/2015 0315   VLDL 14 07/28/2015 0315   LDLCALC 76 07/28/2015 0315    CBC     Component Value Date/Time   WBC 6.4 07/28/2015 0315   RBC 4.12 07/28/2015 0315   HGB 11.8 (L) 07/28/2015 0315   HCT 34.4 (L) 07/28/2015 0315   PLT 281 07/28/2015 0315   MCV 83.5 07/28/2015 0315   MCH 28.6 07/28/2015 0315   MCHC 34.3 07/28/2015 0315   RDW 12.7 07/28/2015 0315    Lab Results  Component Value Date   HGBA1C 6.9 06/19/2016    Assessment & Plan:   1. Type 2 diabetes  mellitus without complication, without long-term current use of insulin (HCC) Controlled with A1c of 6.9 Will recheck A1c at next visit as it is too soon to repeat today. - POCT glucose (manual entry) - metFORMIN (GLUCOPHAGE) 500 MG tablet; TAKE 3 TABLETS BY MOUTH EVERY MORNING AND 2 TABLETS BY MOUTH EVERY EVENING  Dispense: 450 tablet; Refill: 1 - liraglutide (VICTOZA) 18 MG/3ML SOPN; Inject subcutaneously 1.8 mg daily.  Dispense: 9 pen; Refill: 1 - CMP14+EGFR - Lipid panel  2. Other chronic sinusitis Stable - fexofenadine (ALLEGRA ALLERGY) 60 MG tablet; Take 1 tablet (60 mg total) by mouth 2 (two) times daily.  Dispense: 180 tablet; Refill: 1  3. Pure hypercholesterolemia Controlled - atorvastatin (LIPITOR) 20 MG tablet; Take 1 tablet (20 mg total) by mouth daily.  Dispense: 90 tablet; Refill: 1  4. Essential hypertension Controlled - lisinopril (PRINIVIL,ZESTRIL) 5 MG tablet; Take 0.5 tablets (2.5 mg total) by mouth daily.  Dispense: 45 tablet; Refill: 1  5. Screening for breast cancer - MM Digital Screening; Future  6. Iron deficiency anemia, unspecified iron deficiency anemia type Improved Last hemoglobin 11.8 - CBC with Differential/Platelet   Meds ordered this encounter  Medications  . ferrous sulfate 325 (65 FE) MG tablet    Sig: TAKE 1 TABLET (325 MG TOTAL) BY MOUTH DAILY WITH BREAKFAST.    Dispense:  90 tablet    Refill:  2  . metFORMIN (GLUCOPHAGE) 500 MG tablet    Sig: TAKE 3 TABLETS BY MOUTH EVERY MORNING AND 2 TABLETS BY MOUTH EVERY EVENING    Dispense:  450 tablet     Refill:  1    90 day supply  . liraglutide (VICTOZA) 18 MG/3ML SOPN    Sig: Inject subcutaneously 1.8 mg daily.    Dispense:  9 pen    Refill:  1    90 day supply  . fexofenadine (ALLEGRA ALLERGY) 60 MG tablet    Sig: Take 1 tablet (60 mg total) by mouth 2 (two) times daily.    Dispense:  180 tablet    Refill:  1  . atorvastatin (LIPITOR) 20 MG tablet    Sig: Take 1 tablet (20 mg total) by mouth daily.    Dispense:  90 tablet    Refill:  1  . lisinopril (PRINIVIL,ZESTRIL) 5 MG tablet    Sig: Take 0.5 tablets (2.5 mg total) by mouth daily.    Dispense:  45 tablet    Refill:  1  . meloxicam (MOBIC) 7.5 MG tablet    Sig: TAKE 1 TABLET(7.5 MG) BY MOUTH DAILY    Dispense:  90 tablet    Refill:  1    **Patient requests 90 days supply**    Follow-up: Return in about 1 month (around 10/14/2016) for Pap smear.   Arnoldo Morale MD

## 2016-09-14 NOTE — Patient Instructions (Signed)
Diabetes Mellitus and Food It is important for you to manage your blood sugar (glucose) level. Your blood glucose level can be greatly affected by what you eat. Eating healthier foods in the appropriate amounts throughout the day at about the same time each day will help you control your blood glucose level. It can also help slow or prevent worsening of your diabetes mellitus. Healthy eating may even help you improve the level of your blood pressure and reach or maintain a healthy weight. General recommendations for healthful eating and cooking habits include:  Eating meals and snacks regularly. Avoid going long periods of time without eating to lose weight.  Eating a diet that consists mainly of plant-based foods, such as fruits, vegetables, nuts, legumes, and whole grains.  Using low-heat cooking methods, such as baking, instead of high-heat cooking methods, such as deep frying.  Work with your dietitian to make sure you understand how to use the Nutrition Facts information on food labels. How can food affect me? Carbohydrates Carbohydrates affect your blood glucose level more than any other type of food. Your dietitian will help you determine how many carbohydrates to eat at each meal and teach you how to count carbohydrates. Counting carbohydrates is important to keep your blood glucose at a healthy level, especially if you are using insulin or taking certain medicines for diabetes mellitus. Alcohol Alcohol can cause sudden decreases in blood glucose (hypoglycemia), especially if you use insulin or take certain medicines for diabetes mellitus. Hypoglycemia can be a life-threatening condition. Symptoms of hypoglycemia (sleepiness, dizziness, and disorientation) are similar to symptoms of having too much alcohol. If your health care provider has given you approval to drink alcohol, do so in moderation and use the following guidelines:  Women should not have more than one drink per day, and men  should not have more than two drinks per day. One drink is equal to: ? 12 oz of beer. ? 5 oz of wine. ? 1 oz of hard liquor.  Do not drink on an empty stomach.  Keep yourself hydrated. Have water, diet soda, or unsweetened iced tea.  Regular soda, juice, and other mixers might contain a lot of carbohydrates and should be counted.  What foods are not recommended? As you make food choices, it is important to remember that all foods are not the same. Some foods have fewer nutrients per serving than other foods, even though they might have the same number of calories or carbohydrates. It is difficult to get your body what it needs when you eat foods with fewer nutrients. Examples of foods that you should avoid that are high in calories and carbohydrates but low in nutrients include:  Trans fats (most processed foods list trans fats on the Nutrition Facts label).  Regular soda.  Juice.  Candy.  Sweets, such as cake, pie, doughnuts, and cookies.  Fried foods.  What foods can I eat? Eat nutrient-rich foods, which will nourish your body and keep you healthy. The food you should eat also will depend on several factors, including:  The calories you need.  The medicines you take.  Your weight.  Your blood glucose level.  Your blood pressure level.  Your cholesterol level.  You should eat a variety of foods, including:  Protein. ? Lean cuts of meat. ? Proteins low in saturated fats, such as fish, egg whites, and beans. Avoid processed meats.  Fruits and vegetables. ? Fruits and vegetables that may help control blood glucose levels, such as apples,   mangoes, and yams.  Dairy products. ? Choose fat-free or low-fat dairy products, such as milk, yogurt, and cheese.  Grains, bread, pasta, and rice. ? Choose whole grain products, such as multigrain bread, whole oats, and brown rice. These foods may help control blood pressure.  Fats. ? Foods containing healthful fats, such as  nuts, avocado, olive oil, canola oil, and fish.  Does everyone with diabetes mellitus have the same meal plan? Because every person with diabetes mellitus is different, there is not one meal plan that works for everyone. It is very important that you meet with a dietitian who will help you create a meal plan that is just right for you. This information is not intended to replace advice given to you by your health care provider. Make sure you discuss any questions you have with your health care provider. Document Released: 12/07/2004 Document Revised: 08/18/2015 Document Reviewed: 02/06/2013 [employer] Interactive Patient Education  2017 [employer]  

## 2016-09-15 ENCOUNTER — Other Ambulatory Visit: Payer: Self-pay | Admitting: Family Medicine

## 2016-09-15 LAB — CBC WITH DIFFERENTIAL/PLATELET
BASOS ABS: 0.1 10*3/uL (ref 0.0–0.2)
Basos: 1 %
EOS (ABSOLUTE): 0.2 10*3/uL (ref 0.0–0.4)
Eos: 4 %
HEMOGLOBIN: 13.1 g/dL (ref 11.1–15.9)
Hematocrit: 39 % (ref 34.0–46.6)
IMMATURE GRANS (ABS): 0 10*3/uL (ref 0.0–0.1)
IMMATURE GRANULOCYTES: 0 %
LYMPHS: 37 %
Lymphocytes Absolute: 1.9 10*3/uL (ref 0.7–3.1)
MCH: 28.4 pg (ref 26.6–33.0)
MCHC: 33.6 g/dL (ref 31.5–35.7)
MCV: 85 fL (ref 79–97)
MONOCYTES: 4 %
Monocytes Absolute: 0.2 10*3/uL (ref 0.1–0.9)
Neutrophils Absolute: 2.9 10*3/uL (ref 1.4–7.0)
Neutrophils: 54 %
Platelets: 323 10*3/uL (ref 150–379)
RBC: 4.61 x10E6/uL (ref 3.77–5.28)
RDW: 14.6 % (ref 12.3–15.4)
WBC: 5.3 10*3/uL (ref 3.4–10.8)

## 2016-09-15 LAB — CMP14+EGFR
A/G RATIO: 1.4 (ref 1.2–2.2)
ALT: 23 IU/L (ref 0–32)
AST: 20 IU/L (ref 0–40)
Albumin: 4.2 g/dL (ref 3.5–5.5)
Alkaline Phosphatase: 80 IU/L (ref 39–117)
BUN/Creatinine Ratio: 8 — ABNORMAL LOW (ref 9–23)
BUN: 6 mg/dL (ref 6–24)
Bilirubin Total: 0.5 mg/dL (ref 0.0–1.2)
CALCIUM: 10 mg/dL (ref 8.7–10.2)
CO2: 22 mmol/L (ref 20–29)
Chloride: 98 mmol/L (ref 96–106)
Creatinine, Ser: 0.72 mg/dL (ref 0.57–1.00)
GFR, EST AFRICAN AMERICAN: 119 mL/min/{1.73_m2} (ref >59)
GFR, EST NON AFRICAN AMERICAN: 103 mL/min/{1.73_m2} (ref >59)
GLOBULIN, TOTAL: 2.9 g/dL (ref 1.5–4.5)
Glucose: 148 mg/dL — ABNORMAL HIGH (ref 65–99)
POTASSIUM: 4.4 mmol/L (ref 3.5–5.2)
SODIUM: 137 mmol/L (ref 134–144)
Total Protein: 7.1 g/dL (ref 6.0–8.5)

## 2016-09-15 LAB — LIPID PANEL
CHOL/HDL RATIO: 3.6 ratio (ref 0.0–4.4)
Cholesterol, Total: 156 mg/dL (ref 100–199)
HDL: 43 mg/dL (ref >39)
LDL Calculated: 87 mg/dL (ref 0–99)
TRIGLYCERIDES: 130 mg/dL (ref 0–149)
VLDL Cholesterol Cal: 26 mg/dL (ref 5–40)

## 2016-09-18 ENCOUNTER — Other Ambulatory Visit: Payer: Self-pay | Admitting: Family Medicine

## 2016-09-18 DIAGNOSIS — Z1231 Encounter for screening mammogram for malignant neoplasm of breast: Secondary | ICD-10-CM

## 2016-09-19 ENCOUNTER — Telehealth: Payer: Self-pay

## 2016-09-19 ENCOUNTER — Ambulatory Visit
Admission: RE | Admit: 2016-09-19 | Discharge: 2016-09-19 | Disposition: A | Discharge status: Home / Self Care | Payer: BC Managed Care – PPO | Source: Ambulatory Visit | Attending: Family Medicine | Admitting: Family Medicine

## 2016-09-19 DIAGNOSIS — Z1231 Encounter for screening mammogram for malignant neoplasm of breast: Secondary | ICD-10-CM

## 2016-09-19 NOTE — Telephone Encounter (Signed)
Pt was called and informed of lab results. 

## 2016-10-15 ENCOUNTER — Encounter: Payer: Self-pay | Admitting: Family Medicine

## 2016-10-15 ENCOUNTER — Other Ambulatory Visit (HOSPITAL_COMMUNITY)
Admission: RE | Admit: 2016-10-15 | Discharge: 2016-10-15 | Disposition: A | Discharge status: Home / Self Care | Payer: BC Managed Care – PPO | Source: Ambulatory Visit | Attending: Family Medicine | Admitting: Family Medicine

## 2016-10-15 ENCOUNTER — Ambulatory Visit: Payer: BC Managed Care – PPO | Attending: Family Medicine | Admitting: Family Medicine

## 2016-10-15 VITALS — HR 86 | Temp 98.4°F | Resp 18 | Ht 64.0 in | Wt 275.0 lb

## 2016-10-15 DIAGNOSIS — I10 Essential (primary) hypertension: Secondary | ICD-10-CM | POA: Insufficient documentation

## 2016-10-15 DIAGNOSIS — Z794 Long term (current) use of insulin: Secondary | ICD-10-CM | POA: Diagnosis not present

## 2016-10-15 DIAGNOSIS — F329 Major depressive disorder, single episode, unspecified: Secondary | ICD-10-CM | POA: Diagnosis not present

## 2016-10-15 DIAGNOSIS — Z79899 Other long term (current) drug therapy: Secondary | ICD-10-CM | POA: Insufficient documentation

## 2016-10-15 DIAGNOSIS — Z01419 Encounter for gynecological examination (general) (routine) without abnormal findings: Secondary | ICD-10-CM | POA: Diagnosis not present

## 2016-10-15 DIAGNOSIS — Z6841 Body Mass Index (BMI) 40.0 and over, adult: Secondary | ICD-10-CM | POA: Diagnosis not present

## 2016-10-15 DIAGNOSIS — E1100 Type 2 diabetes mellitus with hyperosmolarity without nonketotic hyperglycemic-hyperosmolar coma (NKHHC): Secondary | ICD-10-CM | POA: Insufficient documentation

## 2016-10-15 DIAGNOSIS — F419 Anxiety disorder, unspecified: Secondary | ICD-10-CM | POA: Insufficient documentation

## 2016-10-15 DIAGNOSIS — Z124 Encounter for screening for malignant neoplasm of cervix: Secondary | ICD-10-CM | POA: Insufficient documentation

## 2016-10-15 DIAGNOSIS — E785 Hyperlipidemia, unspecified: Secondary | ICD-10-CM | POA: Insufficient documentation

## 2016-10-15 LAB — POCT GLYCOSYLATED HEMOGLOBIN (HGB A1C): Hemoglobin A1C: 7.1

## 2016-10-15 LAB — GLUCOSE, POCT (MANUAL RESULT ENTRY): POC Glucose: 129 mg/dl — AB (ref 70–99)

## 2016-10-15 MED FILL — DOXEPIN 25 MG CAPSULE: 25 | 30 days supply | Qty: 60 | Fill #0

## 2016-10-15 MED FILL — clonazePAM 1 MG TABS: 1 | 30 days supply | Qty: 60 | Fill #2

## 2016-10-15 NOTE — Patient Instructions (Signed)

## 2016-10-15 NOTE — Progress Notes (Signed)
Subjective:  Patient ID: TENE GATO, female    DOB: 1970/08/24  Age: 46 y.o. MRN: [id number]  CC: Gynecologic Exam   HPI Meghan Finley is a 46 year old female with a history of type 2 diabetes mellitus (A1c 7.1 ) hypertension, hyperlipidemia, anxiety,Obesity who comes in today for a Pap smear.  She had a mammogram last year which was negative for malignancy and one-year repeat mammogram recommended.  She has no acute concerns today.  Past Medical History:  Diagnosis Date  . Anemia   . Anxiety   . Depression   . Diabetes mellitus   . Hyperlipidemia   . Hypertension   . Morbid obesity ([place])     Past Surgical History:  Procedure Laterality Date  . TOOTH EXTRACTION      Not on File   Outpatient Medications Prior to Visit  Medication Sig Dispense Refill  . atorvastatin (LIPITOR) 20 MG tablet Take 1 tablet (20 mg total) by mouth daily. 90 tablet 1  . Blood Glucose Monitoring Suppl (ONE TOUCH ULTRA SYSTEM KIT) W/DEVICE KIT 1 kit by Does not apply route once. 1 each 0  . Cholecalciferol (VITAMIN D-3) 1000 units CAPS Take 2 capsules by mouth daily.    . clonazePAM (KLONOPIN) 1 MG tablet Take 1 mg by mouth 2 (two) times daily as needed for anxiety.    . ferrous sulfate 325 (65 FE) MG tablet TAKE 1 TABLET (325 MG TOTAL) BY MOUTH DAILY WITH BREAKFAST. 90 tablet 2  . fexofenadine (ALLEGRA ALLERGY) 60 MG tablet Take 1 tablet (60 mg total) by mouth 2 (two) times daily. 180 tablet 1  . fluticasone (FLONASE) 50 MCG/ACT nasal spray Place 1 spray into both nostrils daily.    Marland Kitchen glipiZIDE (GLUCOTROL) 10 MG tablet TAKE 1 TABLET BY MOUTH TWICE DAILY BEFORE A MEAL 180 tablet 1  . liraglutide (VICTOZA) 18 MG/3ML SOPN Inject subcutaneously 1.8 mg daily. 9 pen 1  . lisinopril (PRINIVIL,ZESTRIL) 5 MG tablet Take 0.5 tablets (2.5 mg total) by mouth daily. 45 tablet 1  . meloxicam (MOBIC) 7.5 MG tablet TAKE 1 TABLET(7.5 MG) BY MOUTH DAILY 90 tablet 1  . metFORMIN (GLUCOPHAGE) 500 MG tablet TAKE  3 TABLETS BY MOUTH EVERY MORNING AND 2 TABLETS BY MOUTH EVERY EVENING 450 tablet 1  . Olopatadine HCl (PATADAY) 0.2 % SOLN Apply 1 drop to eye daily. Left eye 2.5 mL 0  . omega-3 acid ethyl esters (LOVAZA) 1 g capsule Take 2 g by mouth daily. Reported on 10/07/2015    . ONE TOUCH ULTRA TEST test strip USE THREE TIMES DAILY, BEFORE MEALS, AND AT BEDTIME 300 each 0  . ONETOUCH DELICA LANCETS 22W MISC 1 each by Does not apply route 4 (four) times daily -  before meals and at bedtime. 100 each 11  . Probiotic Product (PROBIOTIC + OMEGA-3 PO) Take 1 tablet by mouth daily. Reported on 10/07/2015     No facility-administered medications prior to visit.     ROS Review of Systems  Constitutional: Negative for activity change and appetite change.  HENT: Negative for sinus pressure and sore throat.   Respiratory: Negative for chest tightness, shortness of breath and wheezing.   Cardiovascular: Negative for chest pain and palpitations.  Gastrointestinal: Negative for abdominal distention, abdominal pain and constipation.  Genitourinary: Negative.   Musculoskeletal: Negative.   Psychiatric/Behavioral: Negative for behavioral problems and dysphoric mood.    Objective:  Pulse 86   Temp 98.4 F (36.9 C) (Oral)  Resp 18   Ht '5\' 4"'$  (1.626 m)   Wt 275 lb (124.7 kg)   LMP 10/08/2016   SpO2 97%   BMI 47.20 kg/m   BP/Weight 10/15/2016 09/14/2016 09/13/9469  Systolic BP - 252 712  Diastolic BP - 79 77  Wt. (Lbs) 275 273.4 276.2  BMI 47.2 46.93 47.41      Physical Exam  Constitutional: She is oriented to person, place, and time. She appears well-developed and well-nourished.  Cardiovascular: Normal rate, normal heart sounds and intact distal pulses.   No murmur heard. Pulmonary/Chest: Effort normal and breath sounds normal. She has no wheezes. She has no rales. She exhibits no tenderness.  Abdominal: Soft. Bowel sounds are normal. She exhibits no distension and no mass. There is no tenderness.    Genitourinary:  Genitourinary Comments: External genitalia, vagina, cervix, adnexa all normal  Musculoskeletal: Normal range of motion.  Neurological: She is alert and oriented to person, place, and time.     Assessment & Plan:   1. Screening for cervical cancer - Cytology - PAP [place] - HIV antibody (with reflex)  2. Type 2 diabetes mellitus with hyperosmolarity without coma, without long-term current use of insulin (HCC) Controlled A1c 7.1 - POCT A1C - Glucose (CBG)   No orders of the defined types were placed in this encounter.   Follow-up: Return in about 3 months (around 01/15/2017) for follow up on Diabetes.   Arnoldo Morale MD

## 2016-10-16 LAB — HIV ANTIBODY (ROUTINE TESTING W REFLEX): HIV Screen 4th Generation wRfx: NONREACTIVE

## 2016-10-17 LAB — CYTOLOGY - PAP
Bacterial vaginitis: NEGATIVE
CANDIDA VAGINITIS: NEGATIVE
Chlamydia: NEGATIVE
DIAGNOSIS: NEGATIVE
HPV: NOT DETECTED
Neisseria Gonorrhea: NEGATIVE
TRICH (WINDOWPATH): NEGATIVE

## 2016-10-18 ENCOUNTER — Telehealth: Payer: Self-pay | Admitting: *Deleted

## 2016-10-18 LAB — CERVICOVAGINAL ANCILLARY ONLY: HERPES (WINDOWPATH): NEGATIVE

## 2016-10-18 NOTE — Telephone Encounter (Signed)
Patient verified DOB Patient is aware of labs being normal. No further questions at this time.

## 2016-10-18 NOTE — Telephone Encounter (Signed)
-----   Message from Arnoldo Morale, MD sent at 10/16/2016  5:03 PM EDT ----- Please inform the patient that labs are normal. Thank you.

## 2016-12-06 ENCOUNTER — Other Ambulatory Visit: Payer: Self-pay | Admitting: Family Medicine

## 2016-12-06 DIAGNOSIS — I10 Essential (primary) hypertension: Secondary | ICD-10-CM

## 2016-12-11 ENCOUNTER — Other Ambulatory Visit: Payer: Self-pay | Admitting: Family Medicine

## 2016-12-11 DIAGNOSIS — E119 Type 2 diabetes mellitus without complications: Secondary | ICD-10-CM

## 2017-01-13 ENCOUNTER — Other Ambulatory Visit: Payer: Self-pay | Admitting: Family Medicine

## 2017-01-18 ENCOUNTER — Ambulatory Visit: Payer: Self-pay | Admitting: Family Medicine

## 2020-07-26 ENCOUNTER — Other Ambulatory Visit: Payer: Self-pay
